# Patient Record
Sex: Female | Born: 1970
Health system: Southern US, Community
[De-identification: ages and names within clinical notes are randomized; demographics above are authoritative.]

## PROBLEM LIST (undated history)

## (undated) DIAGNOSIS — K219 Gastro-esophageal reflux disease without esophagitis: Secondary | ICD-10-CM

## (undated) DIAGNOSIS — M2550 Pain in unspecified joint: Secondary | ICD-10-CM

## (undated) DIAGNOSIS — F988 Other specified behavioral and emotional disorders with onset usually occurring in childhood and adolescence: Secondary | ICD-10-CM

## (undated) DIAGNOSIS — E039 Hypothyroidism, unspecified: Secondary | ICD-10-CM

## (undated) DIAGNOSIS — F32A Depression, unspecified: Secondary | ICD-10-CM

## (undated) DIAGNOSIS — G4733 Obstructive sleep apnea (adult) (pediatric): Secondary | ICD-10-CM

## (undated) DIAGNOSIS — G4710 Hypersomnia, unspecified: Secondary | ICD-10-CM

## (undated) DIAGNOSIS — K589 Irritable bowel syndrome without diarrhea: Secondary | ICD-10-CM

## (undated) DIAGNOSIS — G4730 Sleep apnea, unspecified: Secondary | ICD-10-CM

## (undated) DIAGNOSIS — J45909 Unspecified asthma, uncomplicated: Secondary | ICD-10-CM

## (undated) DIAGNOSIS — E7800 Pure hypercholesterolemia, unspecified: Secondary | ICD-10-CM

## (undated) DIAGNOSIS — T8859XA Other complications of anesthesia, initial encounter: Secondary | ICD-10-CM

## (undated) DIAGNOSIS — L710 Perioral dermatitis: Secondary | ICD-10-CM

## (undated) DIAGNOSIS — M1990 Unspecified osteoarthritis, unspecified site: Secondary | ICD-10-CM

## (undated) DIAGNOSIS — J309 Allergic rhinitis, unspecified: Secondary | ICD-10-CM

## (undated) DIAGNOSIS — F419 Anxiety disorder, unspecified: Secondary | ICD-10-CM

## (undated) DIAGNOSIS — D369 Benign neoplasm, unspecified site: Secondary | ICD-10-CM

## (undated) DIAGNOSIS — M542 Cervicalgia: Secondary | ICD-10-CM

## (undated) DIAGNOSIS — R053 Chronic cough: Secondary | ICD-10-CM

## (undated) DIAGNOSIS — Z973 Presence of spectacles and contact lenses: Secondary | ICD-10-CM

## (undated) DIAGNOSIS — E559 Vitamin D deficiency, unspecified: Secondary | ICD-10-CM

## (undated) DIAGNOSIS — D649 Anemia, unspecified: Secondary | ICD-10-CM

## (undated) HISTORY — PX: COLONOSCOPY: SHX174

## (undated) HISTORY — PX: BUNIONECTOMY: SHX129

## (undated) HISTORY — DX: Chronic cough: R05.3

## (undated) HISTORY — DX: Cervicalgia: M54.2

## (undated) HISTORY — DX: Hypothyroidism, unspecified: E03.9

## (undated) HISTORY — DX: Sleep apnea, unspecified: G47.30

## (undated) HISTORY — PX: CHOLECYSTECTOMY: SHX55

## (undated) HISTORY — DX: Perioral dermatitis: L71.0

## (undated) HISTORY — PX: ESOPHAGOGASTRODUODENOSCOPY: SHX1529

## (undated) HISTORY — DX: Obstructive sleep apnea (adult) (pediatric): G47.33

## (undated) HISTORY — DX: Anxiety disorder, unspecified: F41.9

## (undated) HISTORY — DX: Sleep apnea, unspecified: G47.10

## (undated) HISTORY — DX: Unspecified asthma, uncomplicated: J45.909

## (undated) HISTORY — DX: Benign neoplasm, unspecified site: D36.9

## (undated) HISTORY — DX: Pure hypercholesterolemia, unspecified: E78.00

## (undated) HISTORY — PX: OTHER SURGICAL HISTORY: SHX169

## (undated) HISTORY — DX: Gastro-esophageal reflux disease without esophagitis: K21.9

## (undated) HISTORY — DX: Irritable bowel syndrome, unspecified: K58.9

## (undated) HISTORY — DX: Other specified behavioral and emotional disorders with onset usually occurring in childhood and adolescence: F98.8

## (undated) HISTORY — DX: Pain in unspecified joint: M25.50

## (undated) HISTORY — DX: Allergic rhinitis, unspecified: J30.9

## (undated) HISTORY — DX: Depression, unspecified: F32.A

## (undated) HISTORY — DX: Vitamin D deficiency, unspecified: E55.9

---

## 2003-02-11 ENCOUNTER — Encounter: Payer: Self-pay | Admitting: Neurosurgery

## 2003-02-11 ENCOUNTER — Encounter: Admission: RE | Admit: 2003-02-11 | Discharge: 2003-02-11 | Payer: Self-pay | Admitting: Neurosurgery

## 2003-02-25 ENCOUNTER — Encounter: Payer: Self-pay | Admitting: Neurosurgery

## 2003-02-25 ENCOUNTER — Encounter: Admission: RE | Admit: 2003-02-25 | Discharge: 2003-02-25 | Payer: Self-pay | Admitting: Neurosurgery

## 2003-03-25 ENCOUNTER — Encounter: Admission: RE | Admit: 2003-03-25 | Discharge: 2003-03-25 | Payer: Self-pay | Admitting: Neurosurgery

## 2003-03-25 ENCOUNTER — Encounter: Payer: Self-pay | Admitting: Neurosurgery

## 2004-09-15 ENCOUNTER — Ambulatory Visit: Payer: Self-pay | Admitting: Internal Medicine

## 2004-12-18 ENCOUNTER — Ambulatory Visit: Payer: Self-pay | Admitting: Internal Medicine

## 2005-09-28 ENCOUNTER — Other Ambulatory Visit: Payer: Self-pay

## 2005-09-28 ENCOUNTER — Emergency Department: Payer: Self-pay | Admitting: Emergency Medicine

## 2005-10-16 ENCOUNTER — Ambulatory Visit: Payer: Self-pay | Admitting: Internal Medicine

## 2006-02-04 ENCOUNTER — Ambulatory Visit: Payer: Self-pay | Admitting: Obstetrics and Gynecology

## 2006-03-05 ENCOUNTER — Ambulatory Visit: Payer: Self-pay | Admitting: Gastroenterology

## 2006-03-20 ENCOUNTER — Ambulatory Visit: Payer: Self-pay | Admitting: Surgery

## 2006-11-22 ENCOUNTER — Ambulatory Visit: Payer: Self-pay | Admitting: Podiatry

## 2008-08-24 ENCOUNTER — Ambulatory Visit: Payer: Self-pay | Admitting: Internal Medicine

## 2008-08-26 ENCOUNTER — Ambulatory Visit: Payer: Self-pay | Admitting: Internal Medicine

## 2009-08-30 ENCOUNTER — Ambulatory Visit: Payer: Self-pay | Admitting: Obstetrics and Gynecology

## 2009-09-01 ENCOUNTER — Ambulatory Visit: Payer: Self-pay | Admitting: Obstetrics and Gynecology

## 2010-03-29 ENCOUNTER — Ambulatory Visit: Payer: Self-pay | Admitting: Obstetrics and Gynecology

## 2011-04-11 ENCOUNTER — Ambulatory Visit: Payer: Self-pay | Admitting: Obstetrics and Gynecology

## 2012-04-10 ENCOUNTER — Ambulatory Visit: Payer: Self-pay | Admitting: Internal Medicine

## 2012-04-11 ENCOUNTER — Ambulatory Visit: Payer: Self-pay | Admitting: Internal Medicine

## 2012-04-15 ENCOUNTER — Ambulatory Visit: Payer: Self-pay | Admitting: Obstetrics and Gynecology

## 2012-06-12 ENCOUNTER — Ambulatory Visit: Payer: Self-pay | Admitting: Internal Medicine

## 2012-07-16 ENCOUNTER — Ambulatory Visit: Payer: Self-pay | Admitting: Allergy

## 2012-10-01 ENCOUNTER — Other Ambulatory Visit: Payer: Self-pay | Admitting: Internal Medicine

## 2012-10-01 LAB — TSH: Thyroid Stimulating Horm: 3.48 u[IU]/mL

## 2012-10-01 LAB — LIPID PANEL
HDL Cholesterol: 57 mg/dL (ref 40–60)
Triglycerides: 92 mg/dL (ref 0–200)
VLDL Cholesterol, Calc: 18 mg/dL (ref 5–40)

## 2012-10-01 LAB — COMPREHENSIVE METABOLIC PANEL
Anion Gap: 8 (ref 7–16)
BUN: 12 mg/dL (ref 7–18)
Creatinine: 0.74 mg/dL (ref 0.60–1.30)
EGFR (African American): >60
EGFR (Non-African Amer.): >60
Glucose: 77 mg/dL (ref 65–99)
Osmolality: 280 (ref 275–301)
SGOT(AST): 19 U/L (ref 15–37)
SGPT (ALT): 19 U/L (ref 12–78)
Sodium: 141 mmol/L (ref 136–145)
Total Protein: 7.1 g/dL (ref 6.4–8.2)

## 2012-10-01 LAB — CBC WITH DIFFERENTIAL/PLATELET
Eosinophil #: 0.4 10*3/uL (ref 0.0–0.7)
Eosinophil %: 6.5 %
HGB: 12.7 g/dL (ref 12.0–16.0)
Lymphocyte #: 1.2 10*3/uL (ref 1.0–3.6)
MCV: 97 fL (ref 80–100)
Monocyte #: 0.4 x10 3/mm (ref 0.2–0.9)
Monocyte %: 8 %
Neutrophil %: 60.4 %
Platelet: 284 10*3/uL (ref 150–440)
RBC: 3.79 10*6/uL — ABNORMAL LOW (ref 3.80–5.20)

## 2012-10-01 LAB — URINALYSIS, COMPLETE
Bacteria: NONE SEEN
Bilirubin,UR: NEGATIVE
Glucose,UR: NEGATIVE mg/dL (ref 0–75)
Ketone: NEGATIVE
Specific Gravity: 1.019 (ref 1.003–1.030)
WBC UR: 4 /HPF (ref 0–5)

## 2012-10-01 LAB — BILIRUBIN, DIRECT: Bilirubin, Direct: <0.1 mg/dL (ref 0.00–0.20)

## 2013-07-01 ENCOUNTER — Ambulatory Visit: Payer: Self-pay | Admitting: Internal Medicine

## 2013-07-31 ENCOUNTER — Ambulatory Visit: Payer: Self-pay | Admitting: Obstetrics and Gynecology

## 2014-03-24 ENCOUNTER — Other Ambulatory Visit: Payer: Self-pay | Admitting: Gastroenterology

## 2014-03-27 LAB — STOOL CULTURE

## 2014-04-16 ENCOUNTER — Ambulatory Visit: Payer: Self-pay | Admitting: Gastroenterology

## 2014-04-21 LAB — PATHOLOGY REPORT

## 2014-05-12 DIAGNOSIS — K589 Irritable bowel syndrome without diarrhea: Secondary | ICD-10-CM | POA: Insufficient documentation

## 2014-09-20 ENCOUNTER — Telehealth: Payer: Self-pay | Admitting: Neurology

## 2014-09-20 ENCOUNTER — Ambulatory Visit (INDEPENDENT_AMBULATORY_CARE_PROVIDER_SITE_OTHER): Payer: 59 | Admitting: Neurology

## 2014-09-20 ENCOUNTER — Encounter: Payer: Self-pay | Admitting: Neurology

## 2014-09-20 VITALS — BP 114/72 | HR 83 | Temp 98.1°F | Resp 14 | Ht 64.0 in | Wt 175.0 lb

## 2014-09-20 DIAGNOSIS — J45909 Unspecified asthma, uncomplicated: Secondary | ICD-10-CM | POA: Insufficient documentation

## 2014-09-20 DIAGNOSIS — J4520 Mild intermittent asthma, uncomplicated: Secondary | ICD-10-CM

## 2014-09-20 DIAGNOSIS — G4710 Hypersomnia, unspecified: Secondary | ICD-10-CM

## 2014-09-20 DIAGNOSIS — R0683 Snoring: Secondary | ICD-10-CM

## 2014-09-20 MED ORDER — FLUOXETINE HCL 10 MG PO CAPS: 10.0000 mg | ORAL_CAPSULE | Freq: Every day | ORAL | Status: DC

## 2014-09-20 NOTE — Progress Notes (Signed)
SLEEP MEDICINE CLINIC   Provider:  Larey Seat, M D  Referring Provider: Tracie Harrier, MD Primary Care Physician:   Chief Complaint  Patient presents with  . NP Sleep, Hande    Rm 11, daughter    HPI:  Heather Faulkner is a 43 y.o. female, who is seen here as a referral from Dr.  Geralyn Corwin for a consultation and  re-evaluation of her hypersomnia.   Mrs. Shore reports that her mother confirmed that she has been sleepier than average child's in the past and that she had been someone who always required more sleep. This became most evident after her pregnancies. 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 She did not have shift work. She became more depressed over the last 7 years and wondered if depression or the medications to treat it made her sleepiness worse. She works now for the [employer], is divorced and basically a single mother. There has certainly been a lot of social stressors in her life. She has been followed by Dr. Lora Paula. Her sleepiness as well as her depression has a waning and waxing character. She has been taking a Ventolin inhaler for asthma, Tessalon as needed Rhinocort as needed Klonopin once a day or 0.5 mg, 20 mL of Lexapro daily and she has tried [place] to help her sleep at 3 mg further listed are hyoscyamine Advair Xyzal, Synthroid> She had 2 nasal surgeries in the past.  She does still suffer from allergic rhinitis as well as asthma, hypersomnia and has hypothyroidism.  The sleep habits include a bedtime of 23.00 hours, but is currently on FMLA. She can go earlier now. Her bedroom is cool , quiet and dark, no TV. Reading in an I -pad for several minutes. She will be asleep in 15 minutes or less. She wakes up frequently, repositioning herself in bed . Between 2 and 3 AM will wake and needs some time to go back to sleep. She rises at 6.15  needs an alarm. She does  not feel refreshed and restored. Normally, she sleeps alone- sometimes with the kid ( if sick) sometimes with the dog.  She tries to avoid naps but sometimes gives in- she has an irresistible urge to sleep. She keeps physically active to avoid drowsiness. She has often vivid dreams. She has no sleep paralysis, no dream intrusion, no REM behavior.  She recalls less dreams since being on Lexapro.   The patient underwent a sleep study with a [facility] not long ago in May 2013. At the time she was diagnosed with mild sleep apnea and following the sleep study and MS LT was performed which confirmed a very low sleep latency but normal REM onsets. The patient was already on Lexapro at the time. She had a much higher RDI and AHI during that study. No periodic limb movements were noted. No central apneas were scored. CO2 retention was not found. The overall AHI was 1.5 per hour and the RDI was 8.3. I am not sure if this study was scored with 3% desaturation or 4% desaturation scoring. She had borderline bradycardia and tachycardia during the night. Her MS LT shoulder initial sleep latency ties of 1 minutes 1 time of 1.5 minutes and ties of 3.5 minutes. This places the mean sleep latency of just above 2 minutes. This is a pathological result. Also no REM sleep onset was noted,  this can well be due to the REM Supressant medication she took at the time.   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    Review of Systems: Out of a complete 14 system review, the patient complains of only the following symptoms, and all other reviewed systems are negative.  Hypersomnia .  Epworth score 17 !! out 24  , Fatigue severity score 42  , depression score n/a .    History   Social History  . Marital Status: Divorced    Spouse Name: N/A    Number of Children: N/A  . Years of Education: N/A   Occupational History  . Not on file.    Social History Main Topics  . Smoking status: Former Smoker    Types: Cigarettes  . Smokeless tobacco: Not on file     Comment: social in college  . Alcohol Use: Yes     Comment: occ 2-3 x week  . Drug Use: No  . Sexual Activity: Not on file   Other Topics Concern  . Not on file   Social History Narrative   Right handed, FT - Licensed clinical SW, (on FMLA), Masters SW, Caffeine 2-3 cups coffee, Divorced, 2 kids    Family History  Problem Relation Age of Onset  . Hypertension Mother   . Hyperlipidemia Mother   . Hypertension Father   . Diabetes Maternal Grandfather   . Heart attack Maternal Grandfather   . Stroke Paternal Grandmother   . Celiac disease Daughter     Past Medical History  Diagnosis Date  . Hypothyroidism   . Hypersomnia with sleep apnea   . Asthma   . Allergic rhinitis   . Anxiety   . Tubular adenoma   . Irritable bowel syndrome   . GERD (gastroesophageal reflux disease)   . Persistent hypersomnia     Past Surgical History  Procedure Laterality Date  . Colonoscopy      x 2  . Cesarean      x 2  . Cholecystectomy    . Nasal surgery (polyps)      x2  . Bunionectomy Right     Current Outpatient Prescriptions  Medication Sig Dispense Refill  . albuterol (VENTOLIN HFA) 108 (90 BASE) MCG/ACT inhaler Inhale into the lungs every 6 (six) hours as needed.       . Armodafinil (NUVIGIL) 200 MG TABS Take 200 mg by mouth daily.      . benzonatate (TESSALON) 100 MG capsule Take 100 mg by mouth 3 (three) times daily as needed.       . budesonide (RHINOCORT AQUA) 32 MCG/ACT nasal spray USE 2 SPRAYS IN EACH NOSTRIL DAILY      . buPROPion (WELLBUTRIN XL) 150 MG 24 hr tablet Take 300 mg by mouth daily.      . clonazePAM (KLONOPIN) 0.5 MG tablet Take 0.5 mg by mouth. 1/2 tab bid and then 1 tablet po qhs prn      . Eszopiclone 3 MG TABS Take 2 mg by mouth.      . etonogestrel (NEXPLANON) 68 MG IMPL implant 1 each by Subdermal route once.      [name] FLUoxetine  (PROZAC) 20 MG capsule Take 20 mg by mouth daily.      . Fluticasone-Salmeterol (ADVAIR) 100-50 MCG/DOSE AEPB Inhale 1 puff into the lungs 2 (two) times daily.      [name] levocetirizine (XYZAL) 5 MG tablet  Take 5 mg by mouth every evening.       [name] levothyroxine (SYNTHROID) 112 MCG tablet Take by mouth.      . montelukast (SINGULAIR) 10 MG tablet TAKE ONE TABLET BY MOUTH DAILY      . triazolam (HALCION) 0.125 MG tablet Take 0.25 mg by mouth at bedtime as needed.        No current facility-administered medications for this visit.    Allergies as of 09/20/2014  . (No Known Allergies)    Vitals: BP 114/72  Pulse 83  Temp(Src) 98.1 F (36.7 C) (Oral)  Resp 14  Ht _0  (1.626 m)  Wt 175 lb (79.379 kg)  BMI 30.02 kg/m2  LMP 09/15/2014 Last Weight:  Wt Readings from Last 1 Encounters:  09/20/14 175 lb (79.379 kg)       Last Height:   Ht Readings from Last 1 Encounters:  09/20/14 _1  (1.626 m)    Physical exam:  General: The patient is awake, alert and appears not in acute distress. The patient is well groomed. Head: Normocephalic, atraumatic. Neck is supple. Mallampati 2   neck circumference: 15 . Nasal airflow unrestricted , TMJ is  Not evident . Retrognathia is not seen. Lower jaw is crowded ( dentition)  Cardiovascular:  Regular rate and rhythm, without  murmurs or carotid bruit, and without distended neck veins. Respiratory: Lungs are clear to auscultation. Skin:  Without evidence of edema, or rash Trunk: BMI is elevated and patient  has normal posture.  Neurologic exam : The patient is awake and alert, oriented to place and time.   Memory subjective  described as intact. There is a normal attention span & concentration ability. Speech is fluent without dysarthria, dysphonia or aphasia.  Mood and affect are depressed. Cranial nerves: Pupils are equal and briskly reactive to light. Funduscopic exam without evidence of pallor or edema. Extraocular movements  in vertical and  horizontal planes intact and without nystagmus. Visual fields by finger perimetry are intact. Hearing to finger rub intact.  Facial sensation intact to fine touch. Facial motor strength is symmetric and tongue and uvula move midline.  Motor exam:  Normal tone, muscle bulk and symmetric ,strength in all extremities.  Sensory:  Fine touch, pinprick and vibration were tested in all extremities. Proprioception is  normal.  Coordination: Rapid alternating movements in the fingers/hands is normal. Finger-to-nose maneuver normal without evidence of ataxia, dysmetria or tremor.  Gait and station: Patient walks without assistive device and is able unassisted to climb up to the exam table.  Strength within normal limits. Stance is stable and normal.  Deep tendon reflexes: in the  upper and lower extremities are symmetric and intact.   Assessment:  After physical and neurologic examination, review of laboratory studies, imaging, neurophysiology testing and pre-existing records, assessment is   1) patient with longstanding history of HYPERSOMNIA- tested for narcolepsy by [facility] in 03-2012, yet no SREM s were noted.  Her mean sleep latency was 2.2 minutes and is highly abnormal. Her metabolic panels were repeatedly normal. 2) medication related REM supression- I would like to work with Dr Casimiro Needle to reduce and wean temporarily off SSRI, and any stimulant medication.( If that is not possible , HLA alone)  Her Epworth justifies a repeat MSLT and repeat PSG prior to the MSLT. 3) HLA narcolepsy test for the possible autoimmune origin of the disorder.  Parallel to the MSLT, as insurances do not accept the blood test alone.  The patient was advised of the nature of the diagnosed sleep disorder , the treatment options and risks for general a health and wellness arising from not treating the condition. Visit duration was 45 minutes.   Plan:  Treatment plan and additional workup : wean to 10 mg Prozac daily  from 20 mg for 8 days, than 10 mg every other day.  For 8 days prior to sleep study avoid sleep aids, only melatonin allowed.  Do not drive within 6 hours of taking any sleep aid.       Asencion Partridge Tishawna Larouche MD  09/20/2014

## 2014-09-20 NOTE — Telephone Encounter (Signed)
I would like for her to have it ASAP , can we ask Heather Faulkner if he can open WE nights with  Heather Faulkner huntley?

## 2014-09-20 NOTE — Telephone Encounter (Signed)
Pt does not want to wait until December for her sleep study.  Would like to know if you can order her sleep study to be done at [facility] sleep lab.

## 2014-09-20 NOTE — Patient Instructions (Signed)
Armodafinil tablets What is this medicine? ARMODAFINIL (ar moe DAF i nil) is used to treat excessive sleepiness caused by certain sleep disorders. This includes narcolepsy, sleep apnea, and shift work sleep disorder. This medicine may be used for other purposes; ask your health care provider or pharmacist if you have questions. COMMON BRAND NAME(S): Nuvigil What should I tell my health care provider before I take this medicine? They need to know if you have any of these conditions: -kidney disease -liver disease -an unusual or allergic reaction to armodafinil, modafinil, medicines, foods, dyes, or preservatives -pregnant or trying to get pregnant -breast-feeding How should I use this medicine? Take this medicine by mouth with a glass of water. Follow the directions on the prescription label. Take your doses at regular intervals. Do not take your medicine more often than directed. Do not stop taking except on your doctor's advice. A special MedGuide will be given to you by the pharmacist with each prescription and refill. Be sure to read this information carefully each time. Talk to your pediatrician regarding the use of this medicine in children. While this drug may be prescribed for children as young as 66 years of age for selected conditions, precautions do apply. Overdosage: If you think you have taken too much of this medicine contact a poison control center or emergency room at once. NOTE: This medicine is only for you. Do not share this medicine with others. What if I miss a dose? If you miss a dose, take it as soon as you can. If it is almost time for your next dose, take only that dose. Do not take double or extra doses. What may interact with this medicine? Do not take this medicine with any of the following medications: -amphetamine or dextroamphetamine -dexmethylphenidate or methylphenidate -MAOIs like Carbex, Eldepryl, Marplan, Nardil, and Parnate -pemoline -procarbazine This  medicine may also interact with the following medications: -antifungal medicines like itraconazole or ketoconazole -barbiturates, like phenobarbital -birth control pills or other hormone-containing birth control devices or implants -carbamazepine -cyclosporine -diazepam -medicines for depression, anxiety, or psychotic disturbances -phenytoin -propranolol -triazolam -warfarin This list may not describe all possible interactions. Give your health care provider a list of all the medicines, herbs, non-prescription drugs, or dietary supplements you use. Also tell them if you smoke, drink alcohol, or use illegal drugs. Some items may interact with your medicine. What should I watch for while using this medicine? Visit your doctor or health care professional for regular checks on your progress. The full effect of this medicine may not be seen right away. This medicine may affect your concentration, function, or may hide signs that you are tired. You may get dizzy. Do not drive, use machinery, or do anything that needs mental alertness until you know how this drug affects you. Alcohol can make you more dizzy and may interfere with your response to this medicine or your alertness. Avoid alcoholic drinks. Birth control pills may not work properly while you are taking this medicine. Talk to your doctor about using an extra method of birth control. It is unknown if the effects of this medicine will be increased by the use of caffeine. Caffeine is found in many foods, beverages, and medications. Ask your doctor if you should limit or change your intake of caffeine-containing products while on this medicine. What side effects may I notice from receiving this medicine? Side effects that you should report to your doctor or health care professional as soon as possible: -allergic reactions like skin  rash, itching or hives, swelling of the face, lips, or tongue -breathing problems -chest pain -fast, irregular  heartbeat -increased blood pressure, particularly if you have high blood pressure -mental problems -sore throat, fever, or chills -tremors -vomiting Side effects that usually do not require medical attention (report to your doctor or health care professional if they continue or are bothersome): -headache -nausea, diarrhea, or stomach upset -nervousness -trouble sleeping This list may not describe all possible side effects. Call your doctor for medical advice about side effects. You may report side effects to FDA at 1-800-FDA-1088. Where should I keep my medicine? Keep out of the reach of children. Store at room temperature between 20 and 25 degrees C (68 and 77 degrees F). Throw away any unused medicine after the expiration date. NOTE: This sheet is a summary. It may not cover all possible information. If you have questions about this medicine, talk to your doctor, pharmacist, or health care provider.  2015, Elsevier/Gold Standard. (2009-10-25 21:10:28)  

## 2014-10-05 ENCOUNTER — Telehealth: Payer: Self-pay | Admitting: *Deleted

## 2014-10-05 NOTE — Telephone Encounter (Signed)
I called and was not able to LM on her phone.  I mailed copy of her narcolepsy results to her.

## 2014-10-05 NOTE — Telephone Encounter (Signed)
We have attempted to contact patient and her Mailbox is full for earlier canceled appt.'s.  Heather Faulkner has not offered to work as of yet.

## 2014-10-05 NOTE — Telephone Encounter (Signed)
Please defer her study to [place] or [place] [facility] , as she cannot wait longer. DID james not call back yet ??

## 2014-10-06 NOTE — Telephone Encounter (Signed)
Please refer sleep study to other lab, due to patient  being urgent.

## 2014-10-07 NOTE — Telephone Encounter (Signed)
Please be advised that this patient is scheduled for a MSLT and has a weaning schedule.  I have spoken with her previously and she is aware that it is not in her best interest to move the currently scheduled appointment.  She has no desire for a sooner appointment at this time.

## 2014-10-31 ENCOUNTER — Ambulatory Visit (INDEPENDENT_AMBULATORY_CARE_PROVIDER_SITE_OTHER): Payer: 59 | Admitting: Neurology

## 2014-10-31 DIAGNOSIS — R0683 Snoring: Secondary | ICD-10-CM

## 2014-10-31 DIAGNOSIS — G4710 Hypersomnia, unspecified: Secondary | ICD-10-CM

## 2014-10-31 DIAGNOSIS — G4719 Other hypersomnia: Secondary | ICD-10-CM

## 2014-10-31 NOTE — Sleep Study (Signed)
Please see the scanned sleep study interpretation located in the Procedure tab within the Chart Review section. 

## 2014-11-12 ENCOUNTER — Encounter: Payer: Self-pay | Admitting: Neurology

## 2014-11-12 ENCOUNTER — Telehealth: Payer: Self-pay | Admitting: Neurology

## 2014-11-12 NOTE — Telephone Encounter (Signed)
Called the patient and left a message briefly explaining the results from her recent NPSG/MSLT.  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 Brett Fairy has recommended that the patient proceed with an HLA test.  A copy of her report was sent to Dr. Ginette Pitman for review and the patient has elected to receive her sleep study via mail.

## 2014-11-17 DIAGNOSIS — J4520 Mild intermittent asthma, uncomplicated: Secondary | ICD-10-CM | POA: Insufficient documentation

## 2014-12-20 ENCOUNTER — Ambulatory Visit: Payer: 59 | Admitting: Neurology

## 2014-12-29 ENCOUNTER — Ambulatory Visit (INDEPENDENT_AMBULATORY_CARE_PROVIDER_SITE_OTHER): Payer: 59 | Admitting: Neurology

## 2014-12-29 ENCOUNTER — Encounter: Payer: Self-pay | Admitting: Neurology

## 2014-12-29 VITALS — BP 130/69 | HR 76 | Resp 14 | Ht 63.5 in | Wt 180.0 lb

## 2014-12-29 DIAGNOSIS — G4710 Hypersomnia, unspecified: Secondary | ICD-10-CM

## 2014-12-29 DIAGNOSIS — F3289 Other specified depressive episodes: Secondary | ICD-10-CM | POA: Insufficient documentation

## 2014-12-29 DIAGNOSIS — F519 Sleep disorder not due to a substance or known physiological condition, unspecified: Secondary | ICD-10-CM

## 2014-12-29 DIAGNOSIS — F328 Other depressive episodes: Secondary | ICD-10-CM

## 2014-12-29 MED ORDER — ATOMOXETINE HCL 25 MG PO CAPS: 25.0000 mg | ORAL_CAPSULE | Freq: Every day | ORAL | Status: DC

## 2014-12-29 MED ORDER — ATOMOXETINE HCL 25 MG PO CAPS: ORAL_CAPSULE | ORAL | Status: DC

## 2014-12-29 NOTE — Progress Notes (Signed)
SLEEP MEDICINE CLINIC   Provider:  Larey Seat, M D  Referring Provider: No ref. provider found Primary Care Physician:   Chief Complaint  Patient presents with  . RV sleep results    Rm 11, alone    HPI:  Heather Faulkner is a 44 y.o. female, who is seen here as a referral from Dr.  Elita Quick No ref. provider found for a consultation and  re-evaluation of her hypersomnia.   Heather Faulkner reports that her mother confirmed that she has been sleepier than average child's in the past and that she had been someone who always required more sleep. This became most evident after her pregnancies. 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 She did not have shift work. She became more depressed over the last 7 years and wondered if depression or the medications to treat it made her sleepiness worse. She works now for the [employer], is divorced and basically a single mother. There has certainly been a lot of social stressors in her life. She has been followed by Dr. Lora Paula. Her sleepiness as well as her depression has a waning and waxing character. She has been taking a Ventolin inhaler for asthma, Tessalon as needed Rhinocort as needed Klonopin once a day or 0.5 mg, 20 mL of Lexapro daily and she has tried [place] to help her sleep at 3 mg further listed are hyoscyamine Advair Xyzal, Synthroid>She had 2 nasal surgeries in the past. She does still suffer from allergic rhinitis as well as asthma, hypersomnia and has hypothyroidism.  The sleep habits include a bedtime of 23.00 hours, but is currently on FMLA. She can go earlier now. Her bedroom is cool , quiet and dark, no TV. Reading in an I -pad for several minutes. She will be asleep in 15 minutes or less. She wakes up frequently, repositioning herself in bed . Between 2 and 3 AM will wake and needs some time to go back to sleep. She rises at 6.15  needs an  alarm. She does not feel refreshed and restored. Normally, she sleeps alone- sometimes with the kid ( if sick) sometimes with the dog.  She tries to avoid naps but sometimes gives in- she has an irresistible urge to sleep. She keeps physically active to avoid drowsiness. She has often vivid dreams. She has no sleep paralysis, no dream intrusion, no REM behavior.  She recalls less dreams since being on Lexapro.   The patient underwent a sleep study with a [facility] not long ago in May 2013. At the time she was diagnosed with mild sleep apnea and following the sleep study and MS LT was performed which confirmed a very low sleep latency but normal REM onsets. The patient was already on Lexapro at the time. She had a much higher RDI and AHI during that study. No periodic limb movements were noted. No central apneas were scored. CO2 retention was not found. The overall AHI was 1.5 per hour and the RDI was 8.3. I am not sure if this study was scored with 3% desaturation or 4% desaturation scoring. She had borderline bradycardia and tachycardia during the night. Her MS LT shoulder initial sleep latency ties of 1 minutes 1 time of 1.5 minutes and ties of 3.5 minutes. This places the mean sleep latency of just above 2 minutes. This is a pathological result. Also no REM sleep onset  was noted,  this can well be due to the REM Supressant medication she took at the time. 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     Interval history 12-29-14: Heather Faulkner underwent a sleep study on 10-31-14, after she had endorsed the Epworth sleepiness score again very high at 17 points the pH Q9 from for depression at 12 points and a very high degree of fatigue. She had low blood pressures of 100/66 mmHg her AHI was only 2.2 her RDI 3.9 but she had 181 periodic limb movements these tics and twitches were rhythmic but led to very few arousals only  3.4 per hour which would not be an explanation for the sleepiness as well. The study was valid for M SLT to follow.     Review of Systems: Out of a complete 14 system review, the patient complains of only the following symptoms, and all other reviewed systems are negative.  Hypersomnia .     History   Social History  . Marital Status: Divorced    Spouse Name: N/A    Number of Children: N/A  . Years of Education: N/A   Occupational History  . Not on file.   Social History Main Topics  . Smoking status: Former Smoker    Types: Cigarettes  . Smokeless tobacco: Not on file     Comment: social in college  . Alcohol Use: Yes     Comment: occ 2-3 x week  . Drug Use: No  . Sexual Activity: Not on file   Other Topics Concern  . Not on file   Social History Narrative   Right handed, FT - Licensed clinical SW, (on FMLA), Masters SW, Caffeine 2-3 cups coffee, Divorced, 2 kids    Family History  Problem Relation Age of Onset  . Hypertension Mother   . Hyperlipidemia Mother   . Hypertension Father   . Diabetes Maternal Grandfather   . Heart attack Maternal Grandfather   . Stroke Paternal Grandmother   . Celiac disease Daughter     Past Medical History  Diagnosis Date  . Hypothyroidism   . Hypersomnia with sleep apnea   . Asthma   . Allergic rhinitis   . Anxiety   . Tubular adenoma   . Irritable bowel syndrome   . GERD (gastroesophageal reflux disease)   . Persistent hypersomnia   . OSA (obstructive sleep apnea)     Past Surgical History  Procedure Laterality Date  . Colonoscopy      x 2  . Cesarean      x 2  . Cholecystectomy    . Nasal surgery (polyps)      x2  . Bunionectomy Right     Current Outpatient Prescriptions  Medication Sig Dispense Refill  . albuterol (VENTOLIN HFA) 108 (90 BASE) MCG/ACT inhaler Inhale into the lungs every 6 (six) hours as needed.     . benzonatate (TESSALON) 100 MG capsule Take 100 mg by mouth 3 (three) times daily as  needed.     . budesonide (RHINOCORT AQUA) 32 MCG/ACT nasal spray USE 2 SPRAYS IN EACH NOSTRIL DAILY    . clonazePAM (KLONOPIN) 0.5 MG tablet Take 0.5 mg by mouth. 1/2 tab bid and then 1 tablet po qhs prn    . eszopiclone (LUNESTA) 2 MG TABS tablet Take 2 mg by mouth at bedtime as needed for sleep. Take immediately before bedtime    . etonogestrel (  NEXPLANON) 68 MG IMPL implant 1 each by Subdermal route once.    [name] FLUoxetine (PROZAC) 20 MG capsule Take 20 mg by mouth daily.    . Fluticasone-Salmeterol (ADVAIR) 100-50 MCG/DOSE AEPB Inhale 1 puff into the lungs 2 (two) times daily.    [name] HYDROcodone-homatropine (HYCODAN) 5-1.5 MG/5ML syrup Take by mouth.    . levocetirizine (XYZAL) 5 MG tablet Take 5 mg by mouth every evening.     [name] levofloxacin (LEVAQUIN) 500 MG tablet Take by mouth.    . levothyroxine (SYNTHROID) 112 MCG tablet Take by mouth.    . montelukast (SINGULAIR) 10 MG tablet TAKE ONE TABLET BY MOUTH DAILY    . predniSONE (DELTASONE) 10 MG tablet 6 tabs x 1 day, 5 tabs x 1 day, 4 tabs x 1 day, etc...    . triazolam (HALCION) 0.125 MG tablet Take 0.25 mg by mouth at bedtime as needed.      No current facility-administered medications for this visit.    Allergies as of 12/29/2014  . (No Known Allergies)    Vitals: BP 130/69 mmHg  Pulse 76  Resp 14  Ht 5' 3.5" (1.613 m)  Wt 180 lb (81.647 kg)  BMI 31.38 kg/m2 Last Weight:  Wt Readings from Last 1 Encounters:  12/29/14 180 lb (81.647 kg)       Last Height:   Ht Readings from Last 1 Encounters:  12/29/14 5' 3.5" (1.613 m)    Physical exam:  General: The patient is awake, alert and appears not in acute distress. The patient is well groomed. Head: Normocephalic, atraumatic. Neck is supple. Mallampati 2   neck circumference: 15 . Nasal airflow unrestricted , TMJ is  Not evident . Retrognathia is not seen. Lower jaw is crowded ( dentition)  Cardiovascular:  Regular rate and rhythm, without  murmurs or carotid bruit, and without  distended neck veins. Respiratory: Lungs are clear to auscultation. Skin:  Without evidence of edema, or rash Trunk: BMI is elevated and patient  has normal posture.  Neurologic exam : The patient is awake and alert, oriented to place and time.   Memory subjective  described as intact. There is a normal attention span & concentration ability. Speech is fluent without dysarthria, dysphonia or aphasia.  Mood and affect are depressed. Cranial nerves: Pupils are equal and briskly reactive to light. Hearing to finger rub intact.   Facial sensation intact to fine touch. Facial motor strength is symmetric and tongue and uvula move midline.  Motor exam:  Normal tone, muscle bulk and symmetric, strength in all extremities.  Sensory:  Fine touch, pinprick and vibration,  Proprioception is normal.  Finger-to-nose maneuver normal without evidence of ataxia, dysmetria or tremor.  Gait and station: Patient walks without assistive device and is able unassisted to climb up to the exam table.  Strength within normal limits. Stance is stable and normal.  Deep tendon reflexes: in the  upper and lower extremities are symmetric and intact.   Assessment:  After physical and neurologic examination, review of laboratory studies, imaging, neurophysiology testing and pre-existing records, assessment is  1) no apnea found in PSG, mils PLMs,  Sleep efficiency was high - 490 minutes of sleep !! Her  Perception is different .  MSLT : sleep latency was 8.7 minutes several of her naps were seen with a very brief sleep latency especially the first one in the morning at 1 minute following by 6, 13 and 17.5 minutes. No REM sleep was seen. Given the history  of high fatigue and HLA test was recommended , HLA was negative.    The patient was advised of the nature of the diagnosed sleep disorder, ideopathic hypersomnia, anxiety and panic   , the treatment options and risks for general a health and wellness arising from not  treating the condition. Visit duration was 25 minutes.   Plan:  Treatment plan and additional workup : resumed medication Prozac daily  20 mg. The patient has been suffering from panic attacks, and is in therapy with Dr. Casimiro Needle. i would like for her to take a stimulant. Ritalin was causing palpitation. NUVIGIL did the same  . Under medication, she may be able to return to a 8 hour work day.   Strattera in day time, Klonopin  0.25 mg at night.  Do not drive within 6 hours of taking any sleep aid.   Prn follow up, Dr Casimiro Needle  and PCP got CC.       Heather Partridge Jasiel Apachito MD  12/29/2014

## 2014-12-30 ENCOUNTER — Telehealth: Payer: Self-pay

## 2014-12-30 NOTE — Telephone Encounter (Signed)
Rx has been faxed.

## 2014-12-30 NOTE — Telephone Encounter (Signed)
Patient returned call and would her Rx faxed.

## 2014-12-30 NOTE — Telephone Encounter (Signed)
I called the patient to inquire if she would like the Strattera Rx faxed directly to her pharmacy, or if she prefers to get the written Rx.  Got no answer.  Left message

## 2015-01-04 DIAGNOSIS — G4719 Other hypersomnia: Secondary | ICD-10-CM | POA: Insufficient documentation

## 2015-01-21 ENCOUNTER — Ambulatory Visit: Payer: 59 | Admitting: Neurology

## 2015-06-07 ENCOUNTER — Other Ambulatory Visit
Admission: RE | Admit: 2015-06-07 | Discharge: 2015-06-07 | Disposition: A | Discharge status: Home / Self Care | Payer: 59 | Source: Ambulatory Visit | Attending: Physician Assistant | Admitting: Physician Assistant

## 2015-06-07 DIAGNOSIS — R5383 Other fatigue: Secondary | ICD-10-CM | POA: Diagnosis not present

## 2015-06-07 LAB — CBC WITH DIFFERENTIAL/PLATELET
BASOS PCT: 1 %
Basophils Absolute: 0.1 10*3/uL (ref 0–0.1)
Eosinophils Absolute: 0.4 10*3/uL (ref 0–0.7)
Eosinophils Relative: 6 %
HEMATOCRIT: 38.6 % (ref 35.0–47.0)
Hemoglobin: 13.1 g/dL (ref 12.0–16.0)
LYMPHS ABS: 1.3 10*3/uL (ref 1.0–3.6)
LYMPHS PCT: 20 %
MCH: 32.1 pg (ref 26.0–34.0)
MCHC: 34 g/dL (ref 32.0–36.0)
MCV: 94.6 fL (ref 80.0–100.0)
Monocytes Absolute: 0.4 10*3/uL (ref 0.2–0.9)
Monocytes Relative: 6 %
NEUTROS PCT: 67 %
Neutro Abs: 4.5 10*3/uL (ref 1.4–6.5)
Platelets: 280 10*3/uL (ref 150–440)
RBC: 4.08 MIL/uL (ref 3.80–5.20)
RDW: 12.8 % (ref 11.5–14.5)
WBC: 6.7 10*3/uL (ref 3.6–11.0)

## 2015-06-07 LAB — COMPREHENSIVE METABOLIC PANEL
ALT: 18 U/L (ref 14–54)
AST: 21 U/L (ref 15–41)
Albumin: 4.1 g/dL (ref 3.5–5.0)
Alkaline Phosphatase: 95 U/L (ref 38–126)
Anion gap: 8 (ref 5–15)
BUN: 13 mg/dL (ref 6–20)
CO2: 25 mmol/L (ref 22–32)
CREATININE: 0.7 mg/dL (ref 0.44–1.00)
Calcium: 8.9 mg/dL (ref 8.9–10.3)
Chloride: 105 mmol/L (ref 101–111)
GFR calc non Af Amer: >60 mL/min (ref >60)
GLUCOSE: 88 mg/dL (ref 65–99)
POTASSIUM: 4 mmol/L (ref 3.5–5.1)
Sodium: 138 mmol/L (ref 135–145)
Total Bilirubin: 0.6 mg/dL (ref 0.3–1.2)
Total Protein: 6.9 g/dL (ref 6.5–8.1)

## 2015-06-07 LAB — URINALYSIS COMPLETE WITH MICROSCOPIC (ARMC ONLY)
Bilirubin Urine: NEGATIVE
Glucose, UA: NEGATIVE mg/dL
KETONES UR: NEGATIVE mg/dL
NITRITE: NEGATIVE
Protein, ur: NEGATIVE mg/dL
Specific Gravity, Urine: 1.018 (ref 1.005–1.030)
pH: 5 (ref 5.0–8.0)

## 2015-06-07 LAB — HEMOGLOBIN A1C: Hgb A1c MFr Bld: 5.1 % (ref 4.0–6.0)

## 2015-06-09 LAB — THYROID PANEL WITH TSH
Free Thyroxine Index: 2.2 (ref 1.2–4.9)
T3 UPTAKE RATIO: 30 % (ref 24–39)
T4 TOTAL: 7.3 ug/dL (ref 4.5–12.0)
TSH: 3.96 u[IU]/mL (ref 0.450–4.500)

## 2015-06-30 ENCOUNTER — Ambulatory Visit: Payer: 59 | Admitting: Adult Health

## 2015-07-14 ENCOUNTER — Ambulatory Visit (INDEPENDENT_AMBULATORY_CARE_PROVIDER_SITE_OTHER): Payer: 59 | Admitting: Adult Health

## 2015-07-14 ENCOUNTER — Encounter: Payer: Self-pay | Admitting: Adult Health

## 2015-07-14 VITALS — BP 126/86 | HR 67 | Ht 63.0 in | Wt 185.0 lb

## 2015-07-14 DIAGNOSIS — G4710 Hypersomnia, unspecified: Secondary | ICD-10-CM | POA: Diagnosis not present

## 2015-07-14 MED ORDER — ATOMOXETINE HCL 25 MG PO CAPS: ORAL_CAPSULE | ORAL | Status: DC

## 2015-07-14 NOTE — Patient Instructions (Signed)
Continue Strattera 50 mg in the morning. May take 25 mg at lunch time if needed.  If your symptoms worsen or you develop new symptoms please let us know.

## 2015-07-14 NOTE — Progress Notes (Signed)
PATIENT: Heather Faulkner DOB: May 30, 1971  REASON FOR VISIT: follow up- idiopathic hyper insomnia, anxiety HISTORY FROM: patient  HISTORY O F PRESENT ILLNESS: Heather Faulkner is a 44 year old female with a history of idiopathic hypersomnia as well as anxiety. She returns today for an evaluation. She was started on Strattera 50 mg in the morning. She reports that this has been beneficial. However she has not been taking it consistently. She states that it usually works well in the mornings however by the afternoons she begins to get sleepy again. She has also not been taking it on the weekends. She does report some fatigue. She states that the reason she's not taking it consistently is because some nights she feels that she sleeps well and will not need it. Although she now states that later in the day she wishes she would have taken it. She has been tolerating the medication well. Her blood pressure and heart rate are normal today. She has tried other stimulants in the past such as Ritalin, Nuvigil and Provigil but was unable to tolerate these medications. She denies any new neurological symptoms. She returns today for an evaluation.    HISTORY 12/29/14: Heather Faulkner is a 44 y.o. female, who is seen here as a referral from Dr. Elita Quick No ref. provider found for a consultation and re-evaluation of her hypersomnia.   Heather Faulkner reports that her mother confirmed that she has been sleepier than average child's in the past and that she had been someone who always required more sleep. This became most evident after her pregnancies. 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 She did not have shift work. She became more depressed over the last 7 years and wondered if depression or the medications to treat it made her sleepiness worse. She works now for the [employer], is divorced and basically a single mother. There  has certainly been a lot of social stressors in her life. She has been followed by Dr. Lora Paula. Her sleepiness as well as her depression has a waning and waxing character. She has been taking a Ventolin inhaler for asthma, Tessalon as needed Rhinocort as needed Klonopin once a day or 0.5 mg, 20 mL of Lexapro daily and she has tried [place] to help her sleep at 3 mg further listed are hyoscyamine Advair Xyzal, Synthroid>She had 2 nasal surgeries in the past. She does still suffer from allergic rhinitis as well as asthma, hypersomnia and has hypothyroidism.  The sleep habits include a bedtime of 23.00 hours, but is currently on FMLA. She can go earlier now. Her bedroom is cool , quiet and dark, no TV. Reading in an I -pad for several minutes. She will be asleep in 15 minutes or less. She wakes up frequently, repositioning herself in bed . Between 2 and 3 AM will wake and needs some time to go back to sleep. She rises at 6.15 needs an alarm. She does not feel refreshed and restored. Normally, she sleeps alone- sometimes with the kid ( if sick) sometimes with the dog. She tries to avoid naps but sometimes gives in- she has an irresistible urge to sleep. She keeps physically active to avoid drowsiness. She has often vivid dreams. She has no sleep paralysis, no dream intrusion, no REM behavior. She recalls less dreams since being on Lexapro.   The patient underwent a sleep study with a [facility] not long ago in May 2013. At the time she was diagnosed with mild sleep apnea and following the sleep study and Heather LT was performed which confirmed a very low sleep latency but normal REM onsets. The patient was already on Lexapro at the time. She had a much higher RDI and AHI during that study. No periodic limb movements were noted. No central apneas were scored. CO2 retention was not found. The overall AHI was 1.5 per hour and the RDI was 8.3. I am not sure if this study was scored with 3%  desaturation or 4% desaturation scoring. She had borderline bradycardia and tachycardia during the night. Her Heather LT shoulder initial sleep latency ties of 1 minutes 1 time of 1.5 minutes and ties of 3.5 minutes. This places the mean sleep latency of just above 2 minutes. This is a pathological result. Also no REM sleep onset was noted, this can well be due to the REM Supressant medication she took at the time. 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     Interval history 12-29-14: Heather Faulkner underwent a sleep study on 10-31-14, after she had endorsed the Epworth sleepiness score again very high at 17 points the pH Q9 from for depression at 12 points and a very high degree of fatigue. She had low blood pressures of 100/66 mmHg her AHI was only 2.2 her RDI 3.9 but she had 181 periodic limb movements these tics and twitches were rhythmic but led to very few arousals only 3.4 per hour which would not be an explanation for the sleepiness as well. The study was valid for M SLT to follow.     REVIEW OF SYSTEMS: Out of a complete 14 system review of symptoms, the patient complains only of the following symptoms, and all other reviewed systems are negative.  Appetite change, fatigue, unexpected weight change, swollen abdomen, abdominal pain, frequent waking, daytime sleepiness, snoring, dizziness, depression, nervous/anxious Epworth sleepiness score 14 fatigue severity score 47  ALLERGIES: No Known Allergies  HOME MEDICATIONS: Outpatient Prescriptions Prior to Visit  Medication Sig Dispense Refill  . albuterol (VENTOLIN HFA) 108 (90 BASE) MCG/ACT inhaler Inhale into the lungs every 6 (six) hours as needed.     [name] atomoxetine (STRATTERA) 25 MG capsule Use 50 mg in AM po. 60 capsule 2  . benzonatate (TESSALON) 100 MG capsule Take 100 mg by mouth 3 (three) times daily as needed.     . budesonide (RHINOCORT AQUA) 32 MCG/ACT nasal  spray USE 2 SPRAYS IN EACH NOSTRIL DAILY    . clonazePAM (KLONOPIN) 0.5 MG tablet Take 0.5 mg by mouth. 1/2 tab bid and then 1 tablet po qhs prn    . eszopiclone (LUNESTA) 2 MG TABS tablet Take 2 mg by mouth at bedtime as needed for sleep. Take immediately before bedtime    . etonogestrel (NEXPLANON) 68 MG IMPL implant 1 each by Subdermal route once.    . Fluticasone-Salmeterol (ADVAIR) 100-50 MCG/DOSE AEPB Inhale 1 puff into the lungs 2 (two) times daily.    [name] levocetirizine (XYZAL) 5 MG tablet Take 5 mg by mouth every evening.     [name] levothyroxine (SYNTHROID) 112 MCG tablet Take by mouth.    . montelukast (SINGULAIR) 10 MG tablet TAKE ONE TABLET BY MOUTH DAILY    . predniSONE (DELTASONE) 10 MG tablet 6 tabs x 1 day, 5 tabs x 1 day, 4 tabs x 1 day, etc...    [name]  triazolam (HALCION) 0.125 MG tablet Take 0.25 mg by mouth at bedtime as needed.     [name] FLUoxetine (PROZAC) 20 MG capsule Take 20 mg by mouth daily.     No facility-administered medications prior to visit.    PAST MEDICAL HISTORY: Past Medical History  Diagnosis Date  . Hypothyroidism   . Hypersomnia with sleep apnea   . Asthma   . Allergic rhinitis   . Anxiety   . Tubular adenoma   . Irritable bowel syndrome   . GERD (gastroesophageal reflux disease)   . Persistent hypersomnia   . OSA (obstructive sleep apnea)     PAST SURGICAL HISTORY: Past Surgical History  Procedure Laterality Date  . Colonoscopy      x 2  . Cesarean      x 2  . Cholecystectomy    . Nasal surgery (polyps)      x2  . Bunionectomy Right     FAMILY HISTORY: Family History  Problem Relation Age of Onset  . Hypertension Mother   . Hyperlipidemia Mother   . Hypertension Father   . Diabetes Maternal Grandfather   . Heart attack Maternal Grandfather   . Stroke Paternal Grandmother   . Celiac disease Daughter     SOCIAL HISTORY: Social History   Social History  . Marital Status: Divorced    Spouse Name: N/A  . Number of Children: 2  .  Years of Education: N/A   Occupational History  . Not on file.   Social History Main Topics  . Smoking status: Former Smoker    Types: Cigarettes  . Smokeless tobacco: Never Used     Comment: social in college  . Alcohol Use: 0.0 oz/week    0 Standard drinks or equivalent per week     Comment: occ 2-3 x week  . Drug Use: No  . Sexual Activity: Not on file   Other Topics Concern  . 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  Generalized: Well developed, in no acute distress   Neurological examination  Mentation: Alert oriented to time, place, history taking. Follows all commands speech and language fluent Cranial nerve II-XII: Pupils were equal round reactive to light. Extraocular movements were full, visual field were full on confrontational test. Facial sensation and strength were normal. Uvula tongue midline. Head turning and shoulder shrug  were normal and symmetric. Motor: The motor testing reveals 5 over 5 strength of all 4 extremities. Good symmetric motor tone is noted throughout.  Sensory: Sensory testing is intact to soft touch on all 4 extremities. No evidence of extinction is noted.  Coordination: Cerebellar testing reveals good finger-nose-finger and heel-to-shin bilaterally.  Gait and station: Gait is normal. Tandem gait is normal. Romberg is negative. No drift is seen.  Reflexes: Deep tendon reflexes are symmetric and normal bilaterally.   DIAGNOSTIC DATA (LABS, IMAGING, TESTING) - I reviewed patient records, labs, notes, testing and imaging myself where available.  Lab Results  Component Value Date   WBC 6.7 06/07/2015   HGB 13.1 06/07/2015   HCT 38.6 06/07/2015   MCV 94.6 06/07/2015    PLT 280 06/07/2015  Component Value Date/Time   NA 138 06/07/2015 0845   NA 141 10/01/2012 0919   K 4.0 06/07/2015 0845   K 4.0 10/01/2012 0919   CL 105 06/07/2015 0845   CL 108* 10/01/2012 0919   CO2 25 06/07/2015 0845   CO2 25 10/01/2012 0919   GLUCOSE 88 06/07/2015 0845   GLUCOSE 77 10/01/2012 0919   BUN 13 06/07/2015 0845   BUN 12 10/01/2012 0919   CREATININE 0.70 06/07/2015 0845   CREATININE 0.74 10/01/2012 0919   CALCIUM 8.9 06/07/2015 0845   CALCIUM 8.2* 10/01/2012 0919   PROT 6.9 06/07/2015 0845   PROT 7.1 10/01/2012 0919   ALBUMIN 4.1 06/07/2015 0845   ALBUMIN 3.7 10/01/2012 0919   AST 21 06/07/2015 0845   AST 19 10/01/2012 0919   ALT 18 06/07/2015 0845   ALT 19 10/01/2012 0919   ALKPHOS 95 06/07/2015 0845   ALKPHOS 91 10/01/2012 0919   BILITOT 0.6 06/07/2015 0845   BILITOT 0.5 10/01/2012 0919   GFRNONAA >60 06/07/2015 0845   GFRNONAA >60 10/01/2012 0919   GFRAA >60 06/07/2015 0845   GFRAA >60 10/01/2012 0919   Lab Results  Component Value Date   CHOL 168 10/01/2012   HDL 57 10/01/2012   LDLCALC 93 10/01/2012   TRIG 92 10/01/2012   Lab Results  Component Value Date   HGBA1C 5.1 06/07/2015   No results found for: [id number] Lab Results  Component Value Date   TSH 3.960 06/07/2015      ASSESSMENT AND PLAN 44 y.o. year old female  has a past medical history of Hypothyroidism; Hypersomnia with sleep apnea; Asthma; Allergic rhinitis; Anxiety; Tubular adenoma; Irritable bowel syndrome; GERD (gastroesophageal reflux disease); Persistent hypersomnia; and OSA (obstructive sleep apnea). here with:  1. Hypersomnia 2. Anxiety  The patient will continue taking Strattera 50 mg in a.m. I have encouraged the patient to begin taking this consistently. The patient can also take 25 mg of Strattera (one tablet) at lunchtime if needed. She verbalized understanding. She will continue taking Klonopin at bedtime. Patient advised that if her symptoms worsen or she  develops new symptoms she she'll let us know. She should also let us know she is unable to tolerate the additional dose of Strattera. I have again reviewed the side effects of Strattera with the patient. She verbalized understanding. She will follow-up in 3 months or sooner if needed.  I spent 25 minutes with the patient 50% of this time was counseling the patient and her diagnosis and treatment time.   Ward Givens, MSN, NP-C 07/14/2015, 11:34 AM [facility] [address] [place],  [address] [phone]

## 2015-07-14 NOTE — Progress Notes (Signed)
I agree with the assessment and plan as directed by NP .The patient is known to me .   Emalynn Clewis, MD  

## 2015-08-05 ENCOUNTER — Other Ambulatory Visit: Payer: Self-pay | Admitting: Neurology

## 2015-09-01 ENCOUNTER — Other Ambulatory Visit: Payer: Self-pay | Admitting: Obstetrics and Gynecology

## 2015-09-01 DIAGNOSIS — Z1231 Encounter for screening mammogram for malignant neoplasm of breast: Secondary | ICD-10-CM

## 2015-09-07 ENCOUNTER — Ambulatory Visit
Admission: RE | Admit: 2015-09-07 | Discharge: 2015-09-07 | Disposition: A | Discharge status: Home / Self Care | Payer: 59 | Source: Ambulatory Visit | Attending: Obstetrics and Gynecology | Admitting: Obstetrics and Gynecology

## 2015-09-07 DIAGNOSIS — R928 Other abnormal and inconclusive findings on diagnostic imaging of breast: Secondary | ICD-10-CM | POA: Insufficient documentation

## 2015-09-07 DIAGNOSIS — Z1231 Encounter for screening mammogram for malignant neoplasm of breast: Secondary | ICD-10-CM | POA: Diagnosis present

## 2015-09-12 ENCOUNTER — Other Ambulatory Visit: Payer: Self-pay | Admitting: Obstetrics and Gynecology

## 2015-09-12 DIAGNOSIS — R928 Other abnormal and inconclusive findings on diagnostic imaging of breast: Secondary | ICD-10-CM

## 2015-09-14 ENCOUNTER — Telehealth: Payer: Self-pay | Admitting: Adult Health

## 2015-09-14 MED ORDER — ATOMOXETINE HCL 25 MG PO CAPS: 50.0000 mg | ORAL_CAPSULE | Freq: Every morning | ORAL | Status: DC

## 2015-09-14 NOTE — Telephone Encounter (Signed)
Rx has been sent.  Receipt confirmed by pharmacy.   

## 2015-09-14 NOTE — Telephone Encounter (Signed)
Patient called to request refill of atomoxetine (STRATTERA) 25 MG capsule

## 2015-09-26 ENCOUNTER — Ambulatory Visit
Admission: RE | Admit: 2015-09-26 | Discharge: 2015-09-26 | Disposition: A | Discharge status: Home / Self Care | Payer: 59 | Source: Ambulatory Visit | Attending: Obstetrics and Gynecology | Admitting: Obstetrics and Gynecology

## 2015-09-26 DIAGNOSIS — N63 Unspecified lump in breast: Secondary | ICD-10-CM | POA: Diagnosis present

## 2015-09-26 DIAGNOSIS — R928 Other abnormal and inconclusive findings on diagnostic imaging of breast: Secondary | ICD-10-CM

## 2015-10-17 ENCOUNTER — Ambulatory Visit (INDEPENDENT_AMBULATORY_CARE_PROVIDER_SITE_OTHER): Payer: 59 | Admitting: Adult Health

## 2015-10-17 ENCOUNTER — Encounter: Payer: Self-pay | Admitting: Adult Health

## 2015-10-17 VITALS — BP 107/74 | HR 84 | Ht 63.0 in | Wt 188.0 lb

## 2015-10-17 DIAGNOSIS — G4710 Hypersomnia, unspecified: Secondary | ICD-10-CM | POA: Diagnosis not present

## 2015-10-17 NOTE — Progress Notes (Signed)
PATIENT: Heather Faulkner DOB: February 06, 1971  REASON FOR VISIT: follow up- hypersomnia HISTORY FROM: patient  HISTORY OF PRESENT ILLNESS: Heather Faulkner is a 44 year old female with a history of idiopathic hypersomnia as well as anxiety. She returns today for a follow-up. She is currently taking Strattera 50 mg in the morning and 25 mg at lunchtime if needed. Patient reports that this is working well for her. She states that since the last visit she is only has to take a third tablet 3 days. She states that this medication does not exacerbate her anxiety. She states other stimulants medication caused her anxiety to worsen. She does state that she is no longer employed. She states that her current insurance will run out of in the month. She is unsure if she will be able to afford Strattera after that. She is currently looking for a another job and is considering applying for Medicaid in the meantime. She returns today for an evaluation.  HISTORY 07/14/15: Heather Faulkner is a 44 year old female with a history of idiopathic hypersomnia as well as anxiety. She returns today for an evaluation. She was started on Strattera 50 mg in the morning. She reports that this has been beneficial. However she has not been taking it consistently. She states that it usually works well in the mornings however by the afternoons she begins to get sleepy again. She has also not been taking it on the weekends. She does report some fatigue. She states that the reason she's not taking it consistently is because some nights she feels that she sleeps well and will not need it. Although she now states that later in the day she wishes she would have taken it. She has been tolerating the medication well. Her blood pressure and heart rate are normal today. She has tried other stimulants in the past such as Ritalin, Nuvigil and Provigil but was unable to tolerate these medications. She denies any new neurological symptoms. She returns today for  an evaluation.  REVIEW OF SYSTEMS: Out of a complete 14 system review of symptoms, the patient complains only of the following symptoms, and all other reviewed systems are negative.  Frequent waking, snoring, depression, nervous/anxious  ALLERGIES: No Known Allergies  HOME MEDICATIONS: Outpatient Prescriptions Prior to Visit  Medication Sig Dispense Refill  . albuterol (VENTOLIN HFA) 108 (90 BASE) MCG/ACT inhaler Inhale into the lungs every 6 (six) hours as needed.     [name] atomoxetine (STRATTERA) 25 MG capsule Take 2 capsules (50 mg total) by mouth every morning. And may take one additional capsule at lunch if needed 90 capsule 3  . budesonide (RHINOCORT AQUA) 32 MCG/ACT nasal spray USE 2 SPRAYS IN EACH NOSTRIL DAILY    . clonazePAM (KLONOPIN) 0.5 MG tablet Take 0.5 mg by mouth. 1/2 tab bid and then 1 tablet po qhs prn    . etonogestrel (NEXPLANON) 68 MG IMPL implant 1 each by Subdermal route once.    . Fluticasone-Salmeterol (ADVAIR) 100-50 MCG/DOSE AEPB Inhale 1 puff into the lungs 2 (two) times daily.    [name] Hyoscyamine Sulfate 0.375 MG TBCR Take by mouth as needed.    [name] levocetirizine (XYZAL) 5 MG tablet Take 5 mg by mouth every evening.     [name] levothyroxine (SYNTHROID) 112 MCG tablet Take by mouth.    . montelukast (SINGULAIR) 10 MG tablet TAKE ONE TABLET BY MOUTH DAILY    . Probiotic Product (PROBIOTIC ADVANCED PO) Take by mouth daily.    [name] venlafaxine [facility])  75 MG tablet Take 75 mg by mouth at bedtime.    . benzonatate (TESSALON) 100 MG capsule Take 100 mg by mouth 3 (three) times daily as needed.     . ergocalciferol (VITAMIN D2) 50000 UNITS capsule Take 50,000 Units by mouth once a week.    . eszopiclone (LUNESTA) 2 MG TABS tablet Take 2 mg by mouth at bedtime as needed for sleep. Take immediately before bedtime    . predniSONE (DELTASONE) 10 MG tablet 6 tabs x 1 day, 5 tabs x 1 day, 4 tabs x 1 day, etc...    . triazolam (HALCION) 0.125 MG tablet Take 0.25 mg by mouth at bedtime as  needed.      No facility-administered medications prior to visit.    PAST MEDICAL HISTORY: Past Medical History  Diagnosis Date  . Hypothyroidism   . Hypersomnia with sleep apnea   . Asthma   . Allergic rhinitis   . Anxiety   . Tubular adenoma   . Irritable bowel syndrome   . GERD (gastroesophageal reflux disease)   . Persistent hypersomnia   . OSA (obstructive sleep apnea)     PAST SURGICAL HISTORY: Past Surgical History  Procedure Laterality Date  . Colonoscopy      x 2  . Cesarean      x 2  . Cholecystectomy    . Nasal surgery (polyps)      x2  . Bunionectomy Right     FAMILY HISTORY: Family History  Problem Relation Age of Onset  . Hypertension Mother   . Hyperlipidemia Mother   . Hypertension Father   . Diabetes Maternal Grandfather   . Heart attack Maternal Grandfather   . Stroke Paternal Grandmother   . Celiac disease Daughter     SOCIAL HISTORY: Social History   Social History  . Marital Status: Divorced    Spouse Name: N/A  . Number of Children: 2  . Years of Education: N/A   Occupational History  . Not on file.   Social History Main Topics  . Smoking status: Former Smoker    Types: Cigarettes  . Smokeless tobacco: Never Used     Comment: social in college  . Alcohol Use: 0.0 oz/week    0 Standard drinks or equivalent per week     Comment: occ 2-3 x week  . Drug Use: No  . Sexual Activity: Not on file   Other Topics Concern  . Not on file   Social History Narrative   Right handed, FT - Licensed clinical SW, (on FMLA), Masters SW, Caffeine 2-3 cups coffee, Divorced, 2 kids      PHYSICAL EXAM  Filed Vitals:   10/17/15 0851  BP: 107/74  Pulse: 84  Height: 5\' 3"  (1.6 m)  Weight: 188 lb (85.276 kg)   Body mass index is 33.31 kg/(m^2).  Generalized: Well developed, in no acute distress   Neurological examination  Mentation: Alert oriented to time, place, history taking. Follows all commands speech and language  fluent Cranial nerve II-XII: Pupils were equal round reactive to light. Extraocular movements were full, visual field were full on confrontational test. Facial sensation and strength were normal. Uvula tongue midline. Head turning and shoulder shrug  were normal and symmetric. Motor: The motor testing reveals 5 over 5 strength of all 4 extremities. Good symmetric motor tone is noted throughout.  Sensory: Sensory testing is intact to soft touch on all 4 extremities. No evidence of extinction is noted.  Coordination: Cerebellar testing  reveals good finger-nose-finger and heel-to-shin bilaterally.  Gait and station: Gait is normal. Tandem gait is normal. Romberg is negative. No drift is seen.  Reflexes: Deep tendon reflexes are symmetric and normal bilaterally.   DIAGNOSTIC DATA (LABS, IMAGING, TESTING) - I reviewed patient records, labs, notes, testing and imaging myself where available.    ASSESSMENT AND PLAN 44 y.o. year old female  has a past medical history of Hypothyroidism; Hypersomnia with sleep apnea; Asthma; Allergic rhinitis; Anxiety; Tubular adenoma; Irritable bowel syndrome; GERD (gastroesophageal reflux disease); Persistent hypersomnia; and OSA (obstructive sleep apnea). here with:  1. Hypersomnia  The patient is doing well on Strattera. She will continue this for now. We are reaching out to our pharmacy technician to try to help the patient get assistance with this medication. Patient advised that if her symptoms worsen or she develops new symptoms she should let us know. She will follow-up in 6 months or sooner if needed.  Ward Givens, MSN, NP-C 10/17/2015, 9:13 AM [facility] [address] [place], [place] [address] [phone]

## 2015-10-17 NOTE — Progress Notes (Signed)
I agree with the assessment and plan as directed by NP .The patient is known to me .   Rainn Zupko, MD  

## 2015-10-17 NOTE — Patient Instructions (Signed)
Continue Strattera.  We will notify Janett Billow to see if she can help with medication If your symptoms worsen or you develop new symptoms please let us know.

## 2015-10-18 ENCOUNTER — Telehealth: Payer: Self-pay

## 2015-10-18 NOTE — Telephone Encounter (Signed)
Clinic indicates the patient is interested in obtaining info regarding various assistance programs.  I called the patient, got no answer.  Left message. There are several programs available depending on the medication, and if it is generic or brand name.   Ventolin Brand Name Assistance contact is [phone] [place] Name Assistance contact is [phone] Rhinocort does not have an [place] does not have an [place] Name Assistance contact is [place] Name Assistance contact is [phone] Hyoscyamine does not have an [place] does not have an [place] does not have an [place] Name Assistance contact is [place] Name Assistance contact is [phone] Rx outreach can be contacted at [phone] for any generic medications (most, but not all generics are available through this program) There are also discount vouchers available for all medications at goodrx.com

## 2015-10-27 NOTE — Telephone Encounter (Signed)
Patient reached out to me today and I relayed info from previous portion of this encounter.  She expressed understanding and appreciation and will call us back if anything further is needed.

## 2016-01-31 ENCOUNTER — Other Ambulatory Visit: Payer: Self-pay | Admitting: Obstetrics and Gynecology

## 2016-01-31 DIAGNOSIS — N6001 Solitary cyst of right breast: Secondary | ICD-10-CM

## 2016-03-26 ENCOUNTER — Ambulatory Visit
Admission: RE | Admit: 2016-03-26 | Discharge: 2016-03-26 | Disposition: A | Discharge status: Home / Self Care | Payer: Self-pay | Source: Ambulatory Visit | Attending: Obstetrics and Gynecology | Admitting: Obstetrics and Gynecology

## 2016-03-26 DIAGNOSIS — N6001 Solitary cyst of right breast: Secondary | ICD-10-CM

## 2016-04-16 ENCOUNTER — Ambulatory Visit: Payer: 59 | Admitting: Adult Health

## 2016-06-07 ENCOUNTER — Other Ambulatory Visit: Payer: Self-pay | Admitting: Gastroenterology

## 2016-06-07 DIAGNOSIS — K219 Gastro-esophageal reflux disease without esophagitis: Secondary | ICD-10-CM

## 2016-06-07 DIAGNOSIS — R0989 Other specified symptoms and signs involving the circulatory and respiratory systems: Secondary | ICD-10-CM

## 2016-06-12 ENCOUNTER — Ambulatory Visit
Admission: RE | Admit: 2016-06-12 | Discharge: 2016-06-12 | Disposition: A | Discharge status: Home / Self Care | Payer: BLUE CROSS/BLUE SHIELD | Source: Ambulatory Visit | Attending: Gastroenterology | Admitting: Gastroenterology

## 2016-06-12 DIAGNOSIS — M5030 Other cervical disc degeneration, unspecified cervical region: Secondary | ICD-10-CM | POA: Diagnosis not present

## 2016-06-12 DIAGNOSIS — F458 Other somatoform disorders: Secondary | ICD-10-CM | POA: Insufficient documentation

## 2016-06-12 DIAGNOSIS — K219 Gastro-esophageal reflux disease without esophagitis: Secondary | ICD-10-CM | POA: Diagnosis not present

## 2016-06-12 DIAGNOSIS — R938 Abnormal findings on diagnostic imaging of other specified body structures: Secondary | ICD-10-CM | POA: Insufficient documentation

## 2016-06-12 DIAGNOSIS — R0989 Other specified symptoms and signs involving the circulatory and respiratory systems: Secondary | ICD-10-CM

## 2016-06-20 ENCOUNTER — Ambulatory Visit: Payer: Self-pay | Admitting: Urology

## 2016-07-06 ENCOUNTER — Ambulatory Visit: Payer: 59 | Admitting: Dietician

## 2016-07-20 ENCOUNTER — Encounter: Payer: BLUE CROSS/BLUE SHIELD | Attending: Gastroenterology | Admitting: Dietician

## 2016-07-20 VITALS — Ht 62.0 in | Wt 179.0 lb

## 2016-07-20 DIAGNOSIS — K219 Gastro-esophageal reflux disease without esophagitis: Secondary | ICD-10-CM

## 2016-07-20 DIAGNOSIS — K580 Irritable bowel syndrome with diarrhea: Secondary | ICD-10-CM

## 2016-07-20 DIAGNOSIS — E669 Obesity, unspecified: Secondary | ICD-10-CM

## 2016-07-20 NOTE — Patient Instructions (Signed)
   Choose low-fodmap foods, and eliminate high-fodmaps, especially the high-fructans foods to start. Monitor GI symptoms.   Continue to decrease caffeine; try 1/2-caff coffee, then go to decaf.   Drink plenty of water or lightly flavored waters and limit sodas. Try drinking a glass of water before eating a meal to help with fullness.   For weight loss, aim for 1300-1400 calories daily (not less than 1200, not more than 1500). Protein should be 6-7oz worth each daily, or 2 palm-size portions per day. Keep starchy foods to fist-size (1 cup) or less. Ideally 2/3 cup.   Relax before meal for a few minutes, drink water, then take small bites and chew thoroughly to help with portion control as well as digestion.   Try using a free app like MyFitnessPal or LoseIt to track your food intake.

## 2016-07-20 NOTE — Progress Notes (Signed)
Medical Nutrition Therapy: Visit start time: 0900  end time: 1020  Assessment:  Diagnosis: IBS, GERD, obesity Past medical history: hypothyroidism, asthma, allergic rhinitis Psychosocial issues/ stress concerns: history of depression, anxiety; patient states she is a stressful person Preferred learning method:  . Auditory . Visual  Current weight: 179.0lbs  Height: 5'2" Medications, supplements: reviewed list in chart with patient  Progress and evaluation: Patient reports chronic GI symptoms of cramping, bloating, gas, and some diarrhea. She states that GERD symptoms have also worsened recently. She has a daughter with celiac disease but has tested negative herself. She also would like to lose weight, states her weight has gradually increased in the past few years.    Physical activity: none  Dietary Intake:  Usual eating pattern includes 3 meals and 1-2 snacks per day. Dining out frequency: 5-6 meals per week.  Breakfast: fruit or granola bar/ breakfast bar, or light breakfast sandwich, occasionally cereal with some fiber and nuts: almond clusters; cheese grits. coffee Snack: none Lunch: sometimes snack like pretzels, fruit; or small portion chicken or tuna salad with crackers, leftover meat and veg., salad from [employer] or [employer], lean cuisine or weight watchers meal. Limits sandwiches, bread. Sometimes skips lunch due to busyness.  Snack: occasionally mixed nuts or peanut butter or nutella snack pack, baby bell cheese Supper: gf chicken tenders or pork chops or ham steak + vegetables corn broccoli, asparagus, salad (not often), tacos, spaghetti or pasta alfredo with GF pasta.  Snack: ice cream or popcorn, occasionally cookies; not daily Beverages: some water (not enough per patient), 1 regular coke or root beer or sprite daily, some Gatorade  Nutrition Care Education: Topics covered: IBS, Fodmap diet, GERD, weight management IBS: Fodmap diet, limiting gas-forming or stool-thinning  foods, including caffeine and carbonated beverages; low-fodmap sources of fiber.  GERD: Eating small amounts often, avoiding stress at mealtimes and eating slowly, chewing food well. High-acid foods to avoid. Weight control: benefits of weight control, behavioral changes for weight loss: strategies for portion control, basic kcal, carb, protein needs for weight loss, benefits of tracking food intake.  Other lifestyle changes:  benefits of regular exercise, importance of starting with short duration/ light intensity and gradually increasing.   Nutritional Diagnosis:  [place]-2.1 Inpaired nutrition utilization As related to IBS.  As evidenced by medical diagnosis, patient report. [place]-3.3 Overweight/obesity As related to inactivity, hypothyroidism, excess calories.  As evidenced by patient report.  Intervention: Instruction as noted above.   Set goals with patient direction.    She will call to schedule follow-up; wants to wait until after next MD appointment.   Education Materials given:  . IBS Diet ([facility]) . Fodmap food lists, grocery list . Low-fodmap fiber sources Proofreader) . Goals/ instructions  Learner/ who was taught:  . Patient   Level of understanding: [name] Verbalizes/ demonstrates competency  Demonstrated degree of understanding via:   Teach back Learning barriers: . None  Willingness to learn/ readiness for change: . Eager, change in progress  Monitoring and Evaluation:  Dietary intake, exercise, GI symptoms, and body weight      follow up: in 1 month(s) or 2 months.

## 2016-08-14 ENCOUNTER — Ambulatory Visit (INDEPENDENT_AMBULATORY_CARE_PROVIDER_SITE_OTHER): Payer: BLUE CROSS/BLUE SHIELD | Admitting: Adult Health

## 2016-08-14 ENCOUNTER — Encounter: Payer: Self-pay | Admitting: Adult Health

## 2016-08-14 VITALS — BP 125/83 | HR 80 | Ht 62.0 in | Wt 178.4 lb

## 2016-08-14 DIAGNOSIS — G4711 Idiopathic hypersomnia with long sleep time: Secondary | ICD-10-CM

## 2016-08-14 MED ORDER — ATOMOXETINE HCL 25 MG PO CAPS: 50.0000 mg | ORAL_CAPSULE | Freq: Every morning | ORAL | 5 refills | Status: DC

## 2016-08-14 NOTE — Progress Notes (Signed)
I agree with the assessment and plan as directed by NP .The patient is known to me .   Shaunice Levitan, MD  

## 2016-08-14 NOTE — Patient Instructions (Signed)
Continue Strattera 50 mg in the morning and 25 mg in the afternoon if needed If your symptoms worsen or you develop new symptoms please let us know.

## 2016-08-14 NOTE — Progress Notes (Signed)
PATIENT: Heather Faulkner DOB: 07-06-1971  REASON FOR VISIT: follow up- hypersomnia, anxiety HISTORY FROM: patient  HISTORY OF PRESENT ILLNESS: Heather Faulkner is a 45 year old female with a history of idiopathic hypersomnia and anxiety. She returns today for follow-up. She continues to take Strattera 50 mg in the morning and 25 mg at lunch. She reports that this is working well for her. 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 She reports that she is able to stay awake during the day to complete her job. She states occasionally in the mornings when driving her daughter to school she will feel slightly sleepy although this is not every morning. She states that she is seeing a new psychiatrist who put her on De[place]in. She denies any new neurological symptoms. He returns today for an evaluation.  HISTORY 10/17/15: Heather Faulkner is a 45 year old female with a history of idiopathic hypersomnia as well as anxiety. She returns today for a follow-up. She is currently taking Strattera 50 mg in the morning and 25 mg at lunchtime if needed. Patient reports that this is working well for her. She states that since the last visit she is only has to take a third tablet 3 days. She states that this medication does not exacerbate her anxiety. She states other stimulants medication caused her anxiety to worsen. She does state that she is no longer employed. She states that her current insurance will run out of in the month. She is unsure if she will be able to afford Strattera after that. She is currently looking for a another job and is considering applying for Medicaid in the meantime. She returns today for an evaluation.  HISTORY 07/14/15: Heather Faulkner is a 45 year old female with a history of idiopathic hypersomnia as well as anxiety. She returns today for an evaluation. She was started on Strattera 50 mg in the morning. She reports that this has been beneficial.  However she has not been taking it consistently. She states that it usually works well in the mornings however by the afternoons she begins to get sleepy again. She has also not been taking it on the weekends. She does report some fatigue. She states that the reason she's not taking it consistently is because some nights she feels that she sleeps well and will not need it. Although she now states that later in the day she wishes she would have taken it. She has been tolerating the medication well. Her blood pressure and heart rate are normal today. She has tried other stimulants in the past such as Ritalin, Nuvigil and Provigil but was unable to tolerate these medications. She denies any new neurological symptoms. She returns today for an evaluation.  REVIEW OF SYSTEMS: Out of a complete 14 system review of symptoms, the patient complains only of the following symptoms, and all other reviewed systems are negative.  Diarrhea, joint pain, depression, nervous/anxious, snoring, limb, itching, daytime sleepiness, diarrhea, cough, choking, environmental allergies  ALLERGIES: No Known Allergies  HOME MEDICATIONS: Outpatient Medications Prior to Visit  Medication Sig Dispense Refill  . albuterol (VENTOLIN HFA) 108 (90 BASE) MCG/ACT inhaler Inhale into the lungs every 6 (six) hours as needed.     [name] atomoxetine (STRATTERA) 25 MG capsule Take 2 capsules (50 mg total) by mouth every morning. And may take one additional capsule at lunch if needed 90 capsule 3  . budesonide (RHINOCORT AQUA) 32 MCG/ACT nasal spray USE 2 SPRAYS IN  EACH NOSTRIL DAILY    . clonazePAM (KLONOPIN) 0.5 MG tablet Take 0.5 mg by mouth. 1/2 tab bid and then 1 tablet po qhs prn    . etonogestrel (NEXPLANON) 68 MG IMPL implant 1 each by Subdermal route once.    . Fluticasone-Salmeterol (ADVAIR) 100-50 MCG/DOSE AEPB Inhale 1 puff into the lungs 2 (two) times daily.    [name] Hyoscyamine Sulfate 0.375 MG TBCR Take by mouth as needed.    [name]  levocetirizine (XYZAL) 5 MG tablet Take 5 mg by mouth every evening.     [name] levothyroxine (SYNTHROID) 112 MCG tablet Take by mouth.    . montelukast (SINGULAIR) 10 MG tablet TAKE ONE TABLET BY MOUTH DAILY    . Probiotic Product (PROBIOTIC ADVANCED PO) Take by mouth daily.    . ranitidine (ZANTAC) 150 MG capsule Take 150 mg by mouth 2 (two) times daily.    [name] venlafaxine (EFFEXOR) 75 MG tablet Take 75 mg by mouth at bedtime.     No facility-administered medications prior to visit.     PAST MEDICAL HISTORY: Past Medical History:  Diagnosis Date  . Allergic rhinitis   . Anxiety   . Asthma   . GERD (gastroesophageal reflux disease)   . Hypersomnia with sleep apnea   . Hypothyroidism   . Irritable bowel syndrome   . OSA (obstructive sleep apnea)   . Persistent hypersomnia   . Tubular adenoma     PAST SURGICAL HISTORY: Past Surgical History:  Procedure Laterality Date  . BUNIONECTOMY Right   . cesarean     x 2  . CHOLECYSTECTOMY    . COLONOSCOPY     x 2  . nasal surgery (polyps)     x2    FAMILY HISTORY: Family History  Problem Relation Age of Onset  . Hypertension Mother   . Hyperlipidemia Mother   . Hypertension Father   . Diabetes Maternal Grandfather   . Heart attack Maternal Grandfather   . Stroke Paternal Grandmother   . Celiac disease Daughter     SOCIAL HISTORY: Social History   Social History  . Marital status: Divorced    Spouse name: N/A  . Number of children: 2  . Years of education: N/A   Occupational History  . Not on file.   Social History Main Topics  . Smoking status: Former Smoker    Types: Cigarettes  . Smokeless tobacco: Never Used     Comment: social in college  . Alcohol use 0.0 oz/week     Comment: occ 2-3 x week  . Drug use: No  . Sexual activity: Not on file   Other Topics Concern  . 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  Generalized: Well developed, in no acute distress   Neurological examination  Mentation: Alert oriented to time, place, history taking. Follows all commands speech and language fluent Cranial nerve II-XII: Pupils were equal round reactive to light. Extraocular movements were full, visual field were full on confrontational test. Facial sensation and strength were normal. Uvula tongue midline. Head turning and shoulder shrug  were normal and symmetric. Motor: The motor testing reveals  5 over 5 strength of all 4 extremities. Good symmetric motor tone is noted throughout.  Sensory: Sensory testing is intact to soft touch on all 4 extremities. No evidence of extinction is noted.  Coordination: Cerebellar testing reveals good finger-nose-finger and heel-to-shin bilaterally.  Gait and station: Gait is normal.  Reflexes: Deep tendon reflexes are symmetric and normal bilaterally.   DIAGNOSTIC DATA (LABS, IMAGING, TESTING) - I reviewed patient records, labs, notes, testing and imaging myself where available.  Lab Results  Component Value Date   WBC 6.7 06/07/2015   HGB 13.1 06/07/2015   HCT 38.6 06/07/2015   MCV 94.6 06/07/2015   PLT 280 06/07/2015      Component Value Date/Time   NA 138 06/07/2015 0845   NA 141 10/01/2012 0919   K 4.0 06/07/2015 0845   K 4.0 10/01/2012 0919   CL 105 06/07/2015 0845   CL 108 (H) 10/01/2012 0919   CO2 25 06/07/2015 0845   CO2 25 10/01/2012 0919   GLUCOSE 88 06/07/2015 0845   GLUCOSE 77 10/01/2012 0919   BUN 13 06/07/2015 0845   BUN 12 10/01/2012 0919   CREATININE 0.70 06/07/2015 0845   CREATININE 0.74 10/01/2012 0919   CALCIUM 8.9 06/07/2015 0845   CALCIUM 8.2 (L) 10/01/2012 0919   PROT 6.9 06/07/2015 0845   PROT 7.1 10/01/2012 0919   ALBUMIN 4.1 06/07/2015 0845   ALBUMIN  3.7 10/01/2012 0919   AST 21 06/07/2015 0845   AST 19 10/01/2012 0919   ALT 18 06/07/2015 0845   ALT 19 10/01/2012 0919   ALKPHOS 95 06/07/2015 0845   ALKPHOS 91 10/01/2012 0919   BILITOT 0.6 06/07/2015 0845   BILITOT 0.5 10/01/2012 0919   GFRNONAA >60 06/07/2015 0845   GFRNONAA >60 10/01/2012 0919   GFRAA >60 06/07/2015 0845   GFRAA >60 10/01/2012 0919       ASSESSMENT AND PLAN 45 y.o. year old female  has a past medical history of Allergic rhinitis; Anxiety; Asthma; GERD (gastroesophageal reflux disease); Hypersomnia with sleep apnea; Hypothyroidism; Irritable bowel syndrome; OSA (obstructive sleep apnea); Persistent hypersomnia; and Tubular adenoma. here with:  1. Idiopathic hypersomnia  The patient will continue on Strattera 50 mg in the morning and 25 mg at lunch. Advised patient that if her symptoms worsen or she develops any new she should let us know. Follow-up in 6 months with Dr. Brett Fairy.     Ward Givens, MSN, NP-C 08/14/2016, 10:46 AM [facility] [address] [place], [place] [address] [phone]

## 2016-08-21 ENCOUNTER — Telehealth: Payer: Self-pay | Admitting: *Deleted

## 2016-08-21 NOTE — Telephone Encounter (Signed)
PA initiated thry cover my meds.  Approved for Atomoxetine HCL 25mg  caps 08-20-2016 thru 08-19-2017.  BCBS reference # [id number].  [phone].

## 2016-08-21 NOTE — Telephone Encounter (Signed)
LMVM for pt that got approval for Strattera thru [place].  Was not sure of pharmacy she was using for this drug.  Relayed that she can call and get medication and pharmacy can run it thru.  She is to call back if questions.

## 2016-09-14 ENCOUNTER — Other Ambulatory Visit: Payer: Self-pay | Admitting: Obstetrics and Gynecology

## 2016-09-14 DIAGNOSIS — Z1231 Encounter for screening mammogram for malignant neoplasm of breast: Secondary | ICD-10-CM

## 2016-09-21 ENCOUNTER — Encounter: Payer: Self-pay | Admitting: Radiology

## 2016-09-21 ENCOUNTER — Ambulatory Visit
Admission: RE | Admit: 2016-09-21 | Discharge: 2016-09-21 | Disposition: A | Discharge status: Home / Self Care | Payer: BLUE CROSS/BLUE SHIELD | Source: Ambulatory Visit | Attending: Obstetrics and Gynecology | Admitting: Obstetrics and Gynecology

## 2016-09-21 DIAGNOSIS — Z1231 Encounter for screening mammogram for malignant neoplasm of breast: Secondary | ICD-10-CM | POA: Diagnosis present

## 2016-10-28 ENCOUNTER — Other Ambulatory Visit: Payer: Self-pay | Admitting: Adult Health

## 2017-02-11 ENCOUNTER — Encounter: Payer: Self-pay | Admitting: Neurology

## 2017-02-11 ENCOUNTER — Ambulatory Visit (INDEPENDENT_AMBULATORY_CARE_PROVIDER_SITE_OTHER): Payer: BLUE CROSS/BLUE SHIELD | Admitting: Neurology

## 2017-02-11 VITALS — BP 137/86 | HR 85 | Resp 20 | Ht 63.0 in | Wt 179.0 lb

## 2017-02-11 DIAGNOSIS — G4710 Hypersomnia, unspecified: Secondary | ICD-10-CM | POA: Diagnosis not present

## 2017-02-11 DIAGNOSIS — G4711 Idiopathic hypersomnia with long sleep time: Secondary | ICD-10-CM

## 2017-02-11 DIAGNOSIS — R41840 Attention and concentration deficit: Secondary | ICD-10-CM | POA: Diagnosis not present

## 2017-02-11 MED ORDER — ATOMOXETINE HCL 25 MG PO CAPS: ORAL_CAPSULE | ORAL | 5 refills | Status: DC

## 2017-02-11 NOTE — Patient Instructions (Signed)
STRATTERA Atomoxetine capsules What is this medicine? ATOMOXETINE (AT oh mox e teen) is used to treat attention deficit/hyperactivity disorder, also known as ADHD. It is not a stimulant like other drugs for ADHD. This drug can improve attention span, concentration, and emotional control. It can also reduce restless or overactive behavior. This medicine may be used for other purposes; ask your health care provider or pharmacist if you have questions. COMMON BRAND NAME(S): Strattera What should I tell my health care provider before I take this medicine? They need to know if you have any of these conditions: -glaucoma -high or low blood pressure -history of stroke -irregular heartbeat or other cardiac disease -liver disease -mania or bipolar disorder -pheochromocytoma -suicidal thoughts -an unusual or allergic reaction to atomoxetine, other medicines, foods, dyes, or preservatives -pregnant or trying to get pregnant -breast-feeding How should I use this medicine? Take this medicine by mouth with a glass of water. Follow the directions on the prescription label. You can take it with or without food. If it upsets your stomach, take it with food. If you have difficulty sleeping and you take more than 1 dose per day, take your last dose before 6 PM. Take your medicine at regular intervals. Do not take it more often than directed. Do not stop taking except on your doctor's advice. A special MedGuide will be given to you by the pharmacist with each prescription and refill. Be sure to read this information carefully each time. Talk to your pediatrician regarding the use of this medicine in children. While this drug may be prescribed for children as young as 6 years for selected conditions, precautions do apply. Overdosage: If you think you have taken too much of this medicine contact a poison control center or emergency room at once. NOTE: This medicine is only for you. Do not share this medicine with  others. What if I miss a dose? If you miss a dose, take it as soon as you can. If it is almost time for your next dose, take only that dose. Do not take double or extra doses. What may interact with this medicine? Do not take this medicine with any of the following medications: -cisapride -dofetilide -dronedarone -MAOIs like Carbex, Eldepryl, Marplan, Nardil, and Parnate -pimozide -reboxetine -thioridazine -ziprasidone This medicine may also interact with the following medications: -certain medicines for blood pressure, heart disease, irregular heart beat -certain medicines for depression, anxiety, or psychotic disturbances -certain medicines for lung disease like albuterol -cold or allergy medicines -fluoxetine -medicines that increase blood pressure like dopamine, dobutamine, or ephedrine -other medicines that prolong the QT interval (cause an abnormal heart rhythm) -paroxetine -quinidine -stimulant medicines for attention disorders, weight loss, or to stay awake This list may not describe all possible interactions. Give your health care provider a list of all the medicines, herbs, non-prescription drugs, or dietary supplements you use. Also tell them if you smoke, drink alcohol, or use illegal drugs. Some items may interact with your medicine. What should I watch for while using this medicine? It may take a week or more for this medicine to take effect. This is why it is very important to continue taking the medicine and not miss any doses. If you have been taking this medicine regularly for some time, do not suddenly stop taking it. Ask your doctor or health care professional for advice. Rarely, this medicine may increase thoughts of suicide or suicide attempts in children and teenagers. Call your child's health care professional right away if your  child or teenager has new or increased thoughts of suicide or has changes in mood or behavior like becoming irritable or anxious. Regularly  monitor your child for these behavioral changes. For males, contact you doctor or health care professional right away if you have an erection that lasts longer than 4 hours or if it becomes painful. This may be a sign of serious problem and must be treated right away to prevent permanent damage. You may get drowsy or dizzy. Do not drive, use machinery, or do anything that needs mental alertness until you know how this medicine affects you. Do not stand or sit up quickly, especially if you are an older patient. This reduces the risk of dizzy or fainting spells. Alcohol can make you more drowsy and dizzy. Avoid alcoholic drinks. Do not treat yourself for coughs, colds or allergies without asking your doctor or health care professional for advice. Some ingredients can increase possible side effects. Your mouth may get dry. Chewing sugarless gum or sucking hard candy, and drinking plenty of water will help. What side effects may I notice from receiving this medicine? Side effects that you should report to your doctor or health care professional as soon as possible: -allergic reactions like skin rash, itching or hives, swelling of the face, lips, or tongue -breathing problems -chest pain -dark urine -fast, irregular heartbeat -general ill feeling or flu-like symptoms -high blood pressure -males: prolonged or painful erection -stomach pain or tenderness -trouble passing urine or change in the amount of urine -vomiting -weight loss -yellowing of the eyes or skin Side effects that usually do not require medical attention (report to your doctor or health care professional if they continue or are bothersome): -change in sex drive or performance -constipation or diarrhea -headache -loss of appetite -menstrual period irregularities -nausea -stomach upset This list may not describe all possible side effects. Call your doctor for medical advice about side effects. You may report side effects to FDA at  1-800-FDA-1088. Where should I keep my medicine? Keep out of the reach of children. Store at room temperature between 15 and 30 degrees C (59 and 86 degrees F). Throw away any unused medication after the expiration date. NOTE: This sheet is a summary. It may not cover all possible information. If you have questions about this medicine, talk to your doctor, pharmacist, or health care provider.  2018 Elsevier/Gold Standard (2014-03-26 15:29:22)

## 2017-02-11 NOTE — Progress Notes (Signed)
PATIENT: Heather Faulkner DOB: 10-Nov-1971  REASON FOR VISIT: follow up- hypersomnia, anxiety HISTORY FROM: patient  HISTORY OF PRESENT ILLNESS:  HISTORY 07/14/15: Heather Faulkner is a 46 year old female with a history of idiopathic hypersomnia as well as anxiety. She returns today for an evaluation. She was started on Strattera 50 mg in the morning. She reports that this has been beneficial. However she has not been taking it consistently. She states that it usually works well in the mornings however by the afternoons she begins to get sleepy again. She has also not been taking it on the weekends. She does report some fatigue. She states that the reason she's not taking it consistently is because some nights she feels that she sleeps well and will not need it. Although she now states that later in the day she wishes she would have taken it. She has been tolerating the medication well. Her blood pressure and heart rate are normal today. She has tried other stimulants in the past such as Ritalin, Nuvigil and Provigil but was unable to tolerate these medications. She denies any new neurological symptoms. She returns today for an evaluation.  Interval history from 02/11/2017. Heather Faulkner has last been seen by our nurse practitioner Ward Givens she continues to suffer from excessive daytime sleepiness and fatigue but has responded well to Depakote as vitamin supplement and Strattera 50 Mga day now. She has a new insurance coverage and had to undergo a preauthorization process. Dr. Brett Fairy prescribes the Strattera. She has done well on 50 mg , divided bid. I like to change it to 75 mg in AM today. She had failed modafinil and ritalin, as well as Adderall - all of  which caused palpitation, dumping syndrome, tremors and could not be tolerated.    REVIEW OF SYSTEMS: Out of a complete 14 system review of symptoms, the patient complains only of the following symptoms, and all other reviewed systems are  negative.  She reports problems with concentration and focus, easy distractibility, procrastination, sometimes impulsivity, fatigue severity score was endorsed at 45 points, Epworth sleepiness score is endorsed at 14 points. Palpitations.  Diarrhea,depression, nervous/anxious, snoring, limb, itching, fatigue  daytime sleepiness, diarrhea, cough, choking, environmental allergies  ALLERGIES: No Known Allergies  HOME MEDICATIONS: Outpatient Medications Prior to Visit  Medication Sig Dispense Refill  . albuterol (VENTOLIN HFA) 108 (90 BASE) MCG/ACT inhaler Inhale into the lungs every 6 (six) hours as needed.     [name] atomoxetine (STRATTERA) 25 MG capsule Take 2 capsules (50 mg total) by mouth every morning. And may take one additional capsule at lunch if needed 90 capsule 5  . budesonide (RHINOCORT AQUA) 32 MCG/ACT nasal spray USE 2 SPRAYS IN EACH NOSTRIL DAILY    . clonazePAM (KLONOPIN) 0.5 MG tablet Take 0.5 mg by mouth. 1/2 tab bid and then 1 tablet po qhs prn    . etonogestrel (NEXPLANON) 68 MG IMPL implant 1 each by Subdermal route once.    [name] L-Methylfolate-Algae (DEPLIN 15 PO) Take by mouth. One daily    . levocetirizine (XYZAL) 5 MG tablet Take 5 mg by mouth every evening.     [name] levothyroxine (SYNTHROID) 112 MCG tablet Take by mouth.    . montelukast (SINGULAIR) 10 MG tablet TAKE ONE TABLET BY MOUTH DAILY    . Probiotic Product (PROBIOTIC ADVANCED PO) Take by mouth daily.    [name] venlafaxine (EFFEXOR) 75 MG tablet Take 75 mg by mouth at bedtime.    . Fluticasone-Salmeterol (ADVAIR)  100-50 MCG/DOSE AEPB Inhale 1 puff into the lungs 2 (two) times daily.    [name] Hyoscyamine Sulfate 0.375 MG TBCR Take by mouth as needed.    . ranitidine (ZANTAC) 150 MG capsule Take 150 mg by mouth 2 (two) times daily.     No facility-administered medications prior to visit.     PAST MEDICAL HISTORY: Past Medical History:  Diagnosis Date  . Allergic rhinitis   . Anxiety   . Asthma   . GERD (gastroesophageal  reflux disease)   . Hypersomnia with sleep apnea   . Hypothyroidism   . Irritable bowel syndrome   . OSA (obstructive sleep apnea)   . Persistent hypersomnia   . Tubular adenoma     PAST SURGICAL HISTORY: Past Surgical History:  Procedure Laterality Date  . BUNIONECTOMY Right   . cesarean     x 2  . CHOLECYSTECTOMY    . COLONOSCOPY     x 2  . nasal surgery (polyps)     x2    FAMILY HISTORY: Family History  Problem Relation Age of Onset  . Hypertension Mother   . Hyperlipidemia Mother   . Hypertension Father   . Breast cancer Other   . Diabetes Maternal Grandfather   . Heart attack Maternal Grandfather   . Stroke Paternal Grandmother   . Celiac disease Daughter     SOCIAL HISTORY: Social History   Social History  . Marital status: Divorced    Spouse name: N/A  . Number of children: 2  . Years of education: N/A   Occupational History  . Not on file.   Social History Main Topics  . Smoking status: Former Smoker    Types: Cigarettes  . Smokeless tobacco: Never Used     Comment: social in college  . Alcohol use 0.0 oz/week     Comment: occ 2-3 x week  . Drug use: No  . Sexual activity: Not on file   Other Topics Concern  . Not on file   Social History Narrative   Right handed, FT - Licensed clinical SW, (on [employer]), Masters SW, Caffeine 2-3 cups coffee, Divorced, 2 kids      PHYSICAL EXAM  Vitals:   02/11/17 1030  BP: 137/86  Pulse: 85  Resp: 20  Weight: 179 lb (81.2 kg)  Height: 5\' 3"  (1.6 m)   Body mass index is 31.71 kg/m.  Generalized: Well developed, in no acute distress   Neurological examination  Mentation: Alert oriented to time, place, history taking. Follows all commands speech and language fluent Cranial nerve" no change in taste and smell sensation. Pupils were equal round reactive to light. Extraocular movements were full, visual field were full on confrontational test. Facial sensation and strength were normal. Uvula tongue  midline. Head turning and shoulder shrug  were normal and symmetric. Motor: 5 / 5 strength of all 4 extremities,  symmetric motor tone is noted throughout.  Sensory: intact to soft touch on all 4 extremities. Coordination: intact  finger-nose-finger test bilaterally.  Gait and station: Gait is normal.  Turns with 3 steps, steady gait, no abnormality of tandem , tip toe or hell walking.  Reflexes: Deep tendon reflexes are symmetric and normal bilaterally.   DIAGNOSTIC DATA (LABS, IMAGING, TESTING) - I reviewed patient records, labs, notes, testing and imaging myself where available.  Lab Results  Component Value Date   WBC 6.7 06/07/2015   HGB 13.1 06/07/2015   HCT 38.6 06/07/2015   MCV 94.6 06/07/2015  PLT 280 06/07/2015      Component Value Date/Time   NA 138 06/07/2015 0845   NA 141 10/01/2012 0919   K 4.0 06/07/2015 0845   K 4.0 10/01/2012 0919   CL 105 06/07/2015 0845   CL 108 (H) 10/01/2012 0919   CO2 25 06/07/2015 0845   CO2 25 10/01/2012 0919   GLUCOSE 88 06/07/2015 0845   GLUCOSE 77 10/01/2012 0919   BUN 13 06/07/2015 0845   BUN 12 10/01/2012 0919   CREATININE 0.70 06/07/2015 0845   CREATININE 0.74 10/01/2012 0919   CALCIUM 8.9 06/07/2015 0845   CALCIUM 8.2 (L) 10/01/2012 0919   PROT 6.9 06/07/2015 0845   PROT 7.1 10/01/2012 0919   ALBUMIN 4.1 06/07/2015 0845   ALBUMIN 3.7 10/01/2012 0919   AST 21 06/07/2015 0845   AST 19 10/01/2012 0919   ALT 18 06/07/2015 0845   ALT 19 10/01/2012 0919   ALKPHOS 95 06/07/2015 0845   ALKPHOS 91 10/01/2012 0919   BILITOT 0.6 06/07/2015 0845   BILITOT 0.5 10/01/2012 0919   GFRNONAA >60 06/07/2015 0845   GFRNONAA >60 10/01/2012 0919   GFRAA >60 06/07/2015 0845   GFRAA >60 10/01/2012 0919       ASSESSMENT AND PLAN 46 y.o. year old female  has a past medical history of Allergic rhinitis; Anxiety; Asthma; GERD (gastroesophageal reflux disease); Hypersomnia with sleep apnea; Hypothyroidism; Irritable bowel syndrome; OSA  (obstructive sleep apnea); Persistent hypersomnia; and Tubular adenoma. here with:  1. Idiopathic hypersomnia treated with Strattera.  2. Sleep hygiene is poor- she is trying to change bedtime and TV/Phone habits. 3. Patient suspects a ADHD, please discuss with [facility].   The patient will continue on Strattera but will take 75 mg daily now.   Advised patient that if her symptoms worsen or she develops any new she should let us know.  Follow-up in 6 months with NP Ward Givens, NP.  Larey Seat, MD  02/11/2017, 10:30 AM [facility] [address], [place], [place] [address] [phone]

## 2017-05-28 ENCOUNTER — Other Ambulatory Visit: Payer: Self-pay

## 2017-06-19 ENCOUNTER — Encounter: Payer: Self-pay | Admitting: Urology

## 2017-06-19 ENCOUNTER — Ambulatory Visit (INDEPENDENT_AMBULATORY_CARE_PROVIDER_SITE_OTHER): Payer: BLUE CROSS/BLUE SHIELD | Admitting: Urology

## 2017-06-19 VITALS — BP 111/75 | HR 81 | Ht 63.0 in | Wt 170.0 lb

## 2017-06-19 DIAGNOSIS — R3129 Other microscopic hematuria: Secondary | ICD-10-CM | POA: Diagnosis not present

## 2017-06-19 LAB — URINALYSIS, COMPLETE
Bilirubin, UA: NEGATIVE
Glucose, UA: NEGATIVE
Ketones, UA: NEGATIVE
Leukocytes, UA: NEGATIVE
NITRITE UA: NEGATIVE
PH UA: 5.5 (ref 5.0–7.5)
Protein, UA: NEGATIVE
Specific Gravity, UA: 1.025 (ref 1.005–1.030)
UUROB: 0.2 mg/dL (ref 0.2–1.0)

## 2017-06-19 LAB — MICROSCOPIC EXAMINATION
Bacteria, UA: NONE SEEN
WBC, UA: NONE SEEN /hpf (ref 0–?)

## 2017-06-19 NOTE — Progress Notes (Addendum)
06/19/2017 3:00 PM   GLENOLA WHEAT 05-25-1971 [id number]  Referring provider: Tracie Harrier, MD [address] [facility] [place], [place] [address]  Chief Complaint  Patient presents with  . Hematuria    New patient    HPI: 46 year old female referred for further evaluation of microscopic hematuria. She underwent routine urinalysis by her PCP on 05/17/2017 found to have 4-10 red blood cells per high-powered field. It also had moderate bacteria and crystals present. This is also present on urinalysis on in [address] as well as 2015.  UA not provided today she is currently on her menstrual cycle.  She denies any flank pain or gross hematuria. No personal history of kidney stones.  She denies any urinary symptoms including no frequency, urgency, urge incontinence.  No history of UTIs.  She did smoke socially in college but no significant smoking history. No exposures to secondhand smoke. No exposures to aniline dye or bending.  She does have occasional low back pain and diffuse abdominal pain which she relates to IBS.  No recent renal or abdominopelvic imaging.  She does note that her mother has a history of microscopic hematuria. No family history of GU malignancy.   PMH: Past Medical History:  Diagnosis Date  . Allergic rhinitis   . Anxiety   . Asthma   . GERD (gastroesophageal reflux disease)   . Hypersomnia with sleep apnea   . Hypothyroidism   . Irritable bowel syndrome   . OSA (obstructive sleep apnea)   . Persistent hypersomnia   . Tubular adenoma     Surgical History: Past Surgical History:  Procedure Laterality Date  . BUNIONECTOMY Right   . cesarean     x 2  . CHOLECYSTECTOMY    . COLONOSCOPY     x 2  . nasal surgery (polyps)     x2    Home Medications:  Allergies as of 06/19/2017   No Known Allergies     Medication List       Accurate as of 06/19/17  3:00 PM. Always use your most recent med list.          atomoxetine  25 MG capsule Commonly known as:  STRATTERA Take 3 capsules in AM , 75 mg total.   budesonide 32 MCG/ACT nasal spray Commonly known as:  RHINOCORT AQUA USE 2 SPRAYS IN EACH NOSTRIL DAILY   clonazePAM 0.5 MG tablet Commonly known as:  KLONOPIN Take 0.5 mg by mouth. 1/2 tab bid and then 1 tablet po qhs prn   montelukast 10 MG tablet Commonly known as:  SINGULAIR TAKE ONE TABLET BY MOUTH DAILY   NEXPLANON 68 MG Impl implant Generic drug:  etonogestrel 1 each by Subdermal route once.   PROBIOTIC ADVANCED PO Take by mouth daily.   SYNTHROID 112 MCG tablet Generic drug:  levothyroxine TAKE 1 TAB BY MOUTH DAILY. TAKE ON AN EMPTY STOMACH W/ GLASS OF WATER AT LEAST 30-60MIN B4 BREAKFAST   VENTOLIN HFA 108 (90 Base) MCG/ACT inhaler Generic drug:  albuterol Inhale into the lungs every 6 (six) hours as needed.   Vitamin D (Ergocalciferol) 50000 units Caps capsule Commonly known as:  DRISDOL TAKE 1 CAPSULE (50,000 UNITS TOTAL) BY MOUTH ONCE A WEEK.       Allergies: No Known Allergies  Family History: Family History  Problem Relation Age of Onset  . Hypertension Mother   . Hyperlipidemia Mother   . Hypertension Father   . Breast cancer Other   . Diabetes  Maternal Grandfather   . Heart attack Maternal Grandfather   . Stroke Paternal Grandmother   . Celiac disease Daughter     Social History:  reports that she has quit smoking. Her smoking use included Cigarettes. She has never used smokeless tobacco. She reports that she drinks alcohol. She reports that she does not use drugs.  ROS: UROLOGY Frequent Urination?: Yes Hard to postpone urination?: No Burning/pain with urination?: No Get up at night to urinate?: No Leakage of urine?: No Urine stream starts and stops?: No Trouble starting stream?: No Do you have to strain to urinate?: No Blood in urine?: Yes Urinary tract infection?: No Sexually transmitted disease?: No Injury to kidneys or bladder?: No Painful  intercourse?: No Weak stream?: No Currently pregnant?: No Vaginal bleeding?: No Last menstrual period?: 06/17/2017  Gastrointestinal Nausea?: No Vomiting?: No Indigestion/heartburn?: No Diarrhea?: Yes Constipation?: No  Constitutional Fever: No Night sweats?: No Weight loss?: No Fatigue?: Yes  Skin Skin rash/lesions?: No Itching?: No  Eyes Blurred vision?: No Double vision?: No  Ears/Nose/Throat Sore throat?: No Sinus problems?: Yes  Hematologic/Lymphatic Swollen glands?: No Easy bruising?: Yes  Cardiovascular Leg swelling?: No Chest pain?: No  Respiratory Cough?: Yes Shortness of breath?: No  Endocrine Excessive thirst?: No  Musculoskeletal Back pain?: No Joint pain?: Yes  Neurological Headaches?: No Dizziness?: No  Psychologic Depression?: Yes Anxiety?: Yes  Physical Exam: BP 111/75   Pulse 81   Ht 5\' 3"  (1.6 m)   Wt 170 lb (77.1 kg)   LMP 06/16/2017 (Approximate)   BMI 30.11 kg/m   Constitutional:  Alert and oriented, No acute distress. HEENT: [place] AT, moist mucus membranes.  Trachea midline, no masses. Cardiovascular: No clubbing, cyanosis, or edema. Respiratory: Normal respiratory effort, no increased work of breathing. GI: Abdomen is soft, nontender, nondistended, no abdominal masses GU: No CVA tenderness.  Skin: No rashes, bruises or suspicious lesions. Neurologic: Grossly intact, no focal deficits, moving all 4 extremities. Psychiatric: Normal mood and affect.  Laboratory Data: Cr 0.8 on 05/17/17  Urinalysis UA reviewed, + RBC 4-10 on 05/17/17, 05/22/16, 11/30/15 and 12/29/4  Pertinent Imaging: n/a  Assessment & Plan:    1. Microscopic hematuria We discussed the differential diagnosis for microscopic hematuria including nephrolithiasis, renal or upper tract tumors, bladder stones, UTIs, or bladder tumors as well as undetermined etiologies. Per AUA guidelines, I did recommend complete microscopic hematuria evaluation including  CTU, possible urine cytology, and office cystoscopy.  - Urinalysis, Complete   Return in about 4 weeks (around 07/17/2017) for cytoscopy, f/u CT urogram.  Hollice Espy, MD  [facility] [address] [place], [place] [address] [phone]

## 2017-06-25 ENCOUNTER — Telehealth: Payer: Self-pay | Admitting: Urology

## 2017-06-25 NOTE — Telephone Encounter (Signed)
Patient called today and she was called by [facility] regarding her CT scan that you ordered. They told he that if she went to [facility] in [place] that it would be cheaper for her to have it done there. She wants to now go there to have her Ct scan done. I have reached out to [facility] and asked about being able to view the images thru [facility] and they said they don't use it but they do use Epic, not sure if it's the same one that we use. They gave me the name of a lady to call tomorrow and talk to to verify if we will be able to see them on Epic or not. If not they said they can put them on a CD the same day and send with the patient.  I will need an order for the CT scan written out. I will write it out if you will sign it.   Thanks,  Sharyn Lull

## 2017-06-26 NOTE — Telephone Encounter (Signed)
LMOM

## 2017-06-26 NOTE — Telephone Encounter (Signed)
Please inform her about our experience with outside images, often done incorrectly, occasionally have to be repeated, and will not be able to use an future for comparison.  Additionally, I won't able to discuss this with our radiologist colleagues if there are any worrisome finding.  If she finds it necessary to go outside to have the study done, please provide her with an order.  Order should say CT abd/ pelvis with and without contrast and with delayed imaging (CT urogram)  Hollice Espy, MD

## 2017-06-27 NOTE — Telephone Encounter (Signed)
LMOM

## 2017-06-27 NOTE — Telephone Encounter (Signed)
Spoke with pt in reference to potential problems with have CT at an outside source. Pt stated that she would call [facility] to find out how much CT would cost her. Pt then stated that after she makes a decision she would call back.

## 2017-06-28 ENCOUNTER — Telehealth: Payer: Self-pay

## 2017-06-28 NOTE — Telephone Encounter (Signed)
Pt returned call stating she would like to have CT done at [facility]. What do I need to do from here?

## 2017-07-09 ENCOUNTER — Ambulatory Visit
Admission: RE | Admit: 2017-07-09 | Discharge: 2017-07-09 | Disposition: A | Discharge status: Home / Self Care | Payer: BLUE CROSS/BLUE SHIELD | Source: Ambulatory Visit | Attending: Urology | Admitting: Urology

## 2017-07-09 DIAGNOSIS — R3129 Other microscopic hematuria: Secondary | ICD-10-CM | POA: Insufficient documentation

## 2017-07-09 DIAGNOSIS — R935 Abnormal findings on diagnostic imaging of other abdominal regions, including retroperitoneum: Secondary | ICD-10-CM | POA: Insufficient documentation

## 2017-07-09 MED ORDER — IOPAMIDOL (ISOVUE-300) INJECTION 61%
125.0000 mL | Freq: Once | INTRAVENOUS | Status: AC | PRN
  Administered 2017-07-09: 125 mL via INTRAVENOUS

## 2017-07-11 ENCOUNTER — Telehealth: Payer: Self-pay | Admitting: Urology

## 2017-07-11 NOTE — Telephone Encounter (Signed)
Patient received your message and is returning your call regarding incidental findings on her CT scan.  She can be reached at [phone].

## 2017-07-12 NOTE — Telephone Encounter (Signed)
Called back again, unable to reach, [facility].  Hollice Espy, MD

## 2017-07-16 NOTE — Telephone Encounter (Signed)
Discuss incidental findings on CT scan regarding stomach. She does report that she's been having issues with bloating and gas and does regularly see a gastroenterologist. She's not had upper endoscopy in over 5 years. She will call Dr. Gustavo Lah today to arrange f/u.       IMPRESSION: 1. A cause for hematuria is not identified. 2. Unusual deposition of gas along the nondependent wall of the stomach, without gastric wall thickening, portal venous gas, or small bowel pneumatosis. By all accounts the patient is not acutely ill during this outpatient imaging exam, and accordingly I am not suspicious for emphysematous gastritis or other fulminant causes for gas in the stomach wall. Also, looking back at the chest CT from 2014, there was frothy fluid in the stomach with some gas deposition along the gastric wall as well, I suspect that some of the appearance may well be dietary or simply due to gas collecting along the rugal folds, rather than necessarily from gastric pneumatosis. If the patient has occult GI bleeding or other symptoms which may be related to the stomach, upper endoscopy may be warranted. I reviewed this appearance with Dr. Kalman Jewels, who concurred. 3. 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 4. Suspected small bilateral ovarian cysts. These results will be called to the ordering clinician or representative by the Radiologist Assistant, and communication documented in the PACS or zVision Dashboard.   Electronically Signed   By: Van Clines M.D.   On: 07/09/2017 19:13   Hollice Espy, MD

## 2017-07-17 ENCOUNTER — Ambulatory Visit (INDEPENDENT_AMBULATORY_CARE_PROVIDER_SITE_OTHER): Payer: BLUE CROSS/BLUE SHIELD | Admitting: Urology

## 2017-07-17 ENCOUNTER — Encounter: Payer: Self-pay | Admitting: Urology

## 2017-07-17 VITALS — BP 116/78 | HR 76 | Ht 63.0 in | Wt 171.0 lb

## 2017-07-17 DIAGNOSIS — R3129 Other microscopic hematuria: Secondary | ICD-10-CM | POA: Diagnosis not present

## 2017-07-17 LAB — URINALYSIS, COMPLETE
Bilirubin, UA: NEGATIVE
Glucose, UA: NEGATIVE
Ketones, UA: NEGATIVE
NITRITE UA: NEGATIVE
PH UA: 5.5 (ref 5.0–7.5)
Protein, UA: NEGATIVE
Specific Gravity, UA: >1.03 — ABNORMAL HIGH (ref 1.005–1.030)
UUROB: 0.2 mg/dL (ref 0.2–1.0)

## 2017-07-17 LAB — MICROSCOPIC EXAMINATION

## 2017-07-17 MED ORDER — LIDOCAINE HCL 2 % EX GEL
1.0000 "application " | Freq: Once | CUTANEOUS | Status: AC
  Administered 2017-07-17: 1 via URETHRAL

## 2017-07-17 MED ORDER — CIPROFLOXACIN HCL 500 MG PO TABS
500.0000 mg | ORAL_TABLET | Freq: Once | ORAL | Status: AC
  Administered 2017-07-17: 500 mg via ORAL

## 2017-07-17 NOTE — Progress Notes (Signed)
   07/17/17  CC:  Chief Complaint  Patient presents with  . Cysto    HPI: 46 year old female with microscopic hematuria who presents today for cystoscopy to complete her workup.  She underwent CT urogram earlier this month revealing incidental stomach findings. She has a follow-up with GI to discuss this further.  No GU pathology identified.  Blood pressure 116/78, pulse 76, height 5\' 3"  (1.6 m), weight 171 lb (77.6 kg). NED. A&Ox3.   No respiratory distress   Abd soft, NT, ND Normal external genitalia with patent urethral meatus  Cystoscopy Procedure Note  Patient identification was confirmed, informed consent was obtained, and patient was prepped using Betadine solution.  Lidocaine jelly was administered per urethral meatus.    Preoperative abx where received prior to procedure.    Procedure: - Flexible cystoscope introduced, without any difficulty.   - Thorough search of the bladder revealed:    normal urethral meatus    normal urothelium    no stones    no ulcers     no tumors    no urethral polyps    no trabeculation  - Ureteral orifices were normal in position and appearance.  Post-Procedure: - Patient tolerated the procedure well  Assessment/ Plan:  1. Microscopic hematuria S/p negative work up Cysto today unremarkable CT urogram reviewed- advised to f/u with GI - Urinalysis, Complete - lidocaine (XYLOCAINE) 2 % jelly 1 application; Place 1 application into the urethra once. - ciprofloxacin (CIPRO) tablet 500 mg; Take 1 tablet (500 mg total) by mouth once. e  Hollice Espy, MD

## 2017-07-30 ENCOUNTER — Other Ambulatory Visit: Payer: Self-pay | Admitting: Gastroenterology

## 2017-07-30 DIAGNOSIS — K769 Liver disease, unspecified: Secondary | ICD-10-CM

## 2017-08-05 ENCOUNTER — Ambulatory Visit: Payer: BLUE CROSS/BLUE SHIELD

## 2017-08-06 ENCOUNTER — Ambulatory Visit
Admission: RE | Admit: 2017-08-06 | Discharge: 2017-08-06 | Disposition: A | Discharge status: Home / Self Care | Payer: BLUE CROSS/BLUE SHIELD | Source: Ambulatory Visit | Attending: Gastroenterology | Admitting: Gastroenterology

## 2017-08-06 ENCOUNTER — Other Ambulatory Visit
Admission: RE | Admit: 2017-08-06 | Discharge: 2017-08-06 | Disposition: A | Discharge status: Home / Self Care | Payer: BLUE CROSS/BLUE SHIELD | Source: Ambulatory Visit | Attending: Gastroenterology | Admitting: Gastroenterology

## 2017-08-06 DIAGNOSIS — N9489 Other specified conditions associated with female genital organs and menstrual cycle: Secondary | ICD-10-CM | POA: Diagnosis not present

## 2017-08-06 DIAGNOSIS — K769 Liver disease, unspecified: Secondary | ICD-10-CM

## 2017-08-06 DIAGNOSIS — K7689 Other specified diseases of liver: Secondary | ICD-10-CM | POA: Insufficient documentation

## 2017-08-06 MED ORDER — GADOXETATE DISODIUM 0.25 MMOL/ML IV SOLN
10.0000 mL | Freq: Once | INTRAVENOUS | Status: AC | PRN
  Administered 2017-08-06: 8 mL via INTRAVENOUS

## 2017-08-14 ENCOUNTER — Ambulatory Visit: Payer: BLUE CROSS/BLUE SHIELD | Admitting: Adult Health

## 2017-08-14 LAB — MISCELLANEOUS TEST

## 2017-09-16 ENCOUNTER — Telehealth: Payer: Self-pay | Admitting: Neurology

## 2017-09-16 NOTE — Telephone Encounter (Signed)
PA completed for Atomoxetine thru [place]. May take up to 3 buisness days. KEY: [id number]

## 2017-09-18 NOTE — Telephone Encounter (Signed)
PA approved for the patient for the Atomoxetine HCL 25mg  caps. Approved for 09/16/2017-09/14/2020. REF # [id number]

## 2017-11-06 ENCOUNTER — Other Ambulatory Visit: Payer: Self-pay | Admitting: Obstetrics & Gynecology

## 2017-11-06 ENCOUNTER — Other Ambulatory Visit: Payer: Self-pay | Admitting: Obstetrics and Gynecology

## 2017-11-06 DIAGNOSIS — Z1231 Encounter for screening mammogram for malignant neoplasm of breast: Secondary | ICD-10-CM

## 2017-11-12 ENCOUNTER — Ambulatory Visit: Payer: BLUE CROSS/BLUE SHIELD | Admitting: Adult Health

## 2017-12-03 ENCOUNTER — Ambulatory Visit
Admission: RE | Admit: 2017-12-03 | Discharge: 2017-12-03 | Disposition: A | Discharge status: Home / Self Care | Payer: BLUE CROSS/BLUE SHIELD | Source: Ambulatory Visit | Attending: Obstetrics & Gynecology | Admitting: Obstetrics & Gynecology

## 2017-12-03 DIAGNOSIS — Z1231 Encounter for screening mammogram for malignant neoplasm of breast: Secondary | ICD-10-CM | POA: Diagnosis not present

## 2018-02-26 ENCOUNTER — Encounter (INDEPENDENT_AMBULATORY_CARE_PROVIDER_SITE_OTHER): Payer: Self-pay

## 2018-02-26 ENCOUNTER — Ambulatory Visit: Payer: BLUE CROSS/BLUE SHIELD | Admitting: Adult Health

## 2018-02-26 ENCOUNTER — Encounter: Payer: Self-pay | Admitting: Adult Health

## 2018-02-26 VITALS — BP 122/83 | HR 80 | Ht 63.0 in | Wt 166.8 lb

## 2018-02-26 DIAGNOSIS — G4711 Idiopathic hypersomnia with long sleep time: Secondary | ICD-10-CM | POA: Diagnosis not present

## 2018-02-26 DIAGNOSIS — R200 Anesthesia of skin: Secondary | ICD-10-CM

## 2018-02-26 MED ORDER — ATOMOXETINE HCL 25 MG PO CAPS: ORAL_CAPSULE | ORAL | 5 refills | Status: DC

## 2018-02-26 NOTE — Progress Notes (Signed)
I agree with the assessment and plan as directed by NP .The patient is known to me .   Alonso Gapinski, MD  

## 2018-02-26 NOTE — Patient Instructions (Signed)
Your Plan:  Continue Strattera 75 mg daily If your symptoms worsen or you develop new symptoms please let us know.    Thank you for coming to see [place] at [facility]. I hope we have been able to provide you high quality care today.  You may receive a patient satisfaction survey over the next few weeks. We would appreciate your feedback and comments so that we may continue to improve ourselves and the health of our patients.

## 2018-02-26 NOTE — Progress Notes (Signed)
PATIENT: Heather Faulkner DOB: [birth year]  REASON FOR VISIT: follow up HISTORY FROM: patient  HISTORY OF PRESENT ILLNESS: Today 02/26/18 Heather Faulkner is a 47 year old female with a history of idiopathic hypersomnia and anxiety.  She returns today for follow-up.  She is currently taking Strattera 75 mg daily.  She reports that this continues to work well for her.  She denies any difficulty with staying awake during the day.  She continues to see her psychiatrist.  She does report some numbness in the right hand when she wakes ups. She reports that it does not occur every morning but is fairly consistent.  She does  a lot of typing on a computer.  She feels that this may be carpal tunnel syndrome.  She has never tried wearing a wrist brace.  She also reports a mild tremor in both hands typically while she is typing.  She reports that this is not consistent.  Reports that she has not paid enough attention to notice a pattern or potential trigger.  She also reports that she has trouble with her long-term memory.  Reports that she has trouble remembering events that happened in her childhood or even events that happened 2 or 3 years ago.  Denies any changes with her short-term memory.  She returns today for an evaluation.  HISTORY 08/14/16: Heather Faulkner is a 47 year old female with a history of idiopathic hypersomnia and anxiety. She returns today for follow-up. She continues to take Strattera 50 mg in the morning and 25 mg at lunch. She reports that this is working well for her. 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 She reports that she is able to stay awake during the day to complete her job. She states occasionally in the mornings when driving her daughter to school she will feel slightly sleepy although this is not every morning. She states that she is seeing a new psychiatrist who put her on [place]. She denies any new neurological symptoms. He  returns today for an evaluation.    REVIEW OF SYSTEMS: Out of a complete 14 system review of symptoms, the patient complains only of the following symptoms, and all other reviewed systems are negative.  Cough, memory loss, numbness, tremors, joint pain, joint swelling, neck stiffness, depression, nervous/anxious, rash  FSS 29, Epworth 8  ALLERGIES: No Known Allergies  HOME MEDICATIONS: Outpatient Medications Prior to Visit  Medication Sig Dispense Refill  . albuterol (VENTOLIN HFA) 108 (90 BASE) MCG/ACT inhaler Inhale into the lungs every 6 (six) hours as needed.     [name] atomoxetine (STRATTERA) 25 MG capsule Take 3 capsules in AM , 75 mg total. 90 capsule 5  . budesonide (RHINOCORT AQUA) 32 MCG/ACT nasal spray USE 2 SPRAYS IN EACH NOSTRIL DAILY    . clonazePAM (KLONOPIN) 0.5 MG tablet Take 0.5 mg by mouth. 1/2 tab bid and then 1 tablet po qhs prn    . etonogestrel (NEXPLANON) 68 MG IMPL implant 1 each by Subdermal route once.    . montelukast (SINGULAIR) 10 MG tablet TAKE ONE TABLET BY MOUTH DAILY    . Probiotic Product (PROBIOTIC ADVANCED PO) Take by mouth daily.    [name] SYNTHROID 112 MCG tablet TAKE 1 TAB BY MOUTH DAILY. TAKE ON AN EMPTY STOMACH W/ GLASS OF WATER AT LEAST 30-60MIN B4 BREAKFAST  1  . Vitamin D, Ergocalciferol, (DRISDOL) 50000 units CAPS capsule TAKE 1 CAPSULE (50,000 UNITS TOTAL) BY MOUTH ONCE  A WEEK.  3   No facility-administered medications prior to visit.     PAST MEDICAL HISTORY: Past Medical History:  Diagnosis Date  . Allergic rhinitis   . Anxiety   . Asthma   . GERD (gastroesophageal reflux disease)   . Hypersomnia with sleep apnea   . Hypothyroidism   . Irritable bowel syndrome   . OSA (obstructive sleep apnea)   . Persistent hypersomnia   . Tubular adenoma     PAST SURGICAL HISTORY: Past Surgical History:  Procedure Laterality Date  . BUNIONECTOMY Right   . cesarean     x 2  . CHOLECYSTECTOMY    . COLONOSCOPY     x 2  . nasal surgery  (polyps)     x2    FAMILY HISTORY: Family History  Problem Relation Age of Onset  . Hypertension Mother   . Hyperlipidemia Mother   . Hypertension Father   . Breast cancer Other   . Diabetes Maternal Grandfather   . Heart attack Maternal Grandfather   . Stroke Paternal Grandmother   . Celiac disease Daughter     SOCIAL HISTORY: Social History   Socioeconomic History  . Marital status: Divorced    Spouse name: Not on file  . Number of children: 2  . Years of education: Not on file  . Highest education level: Not on file  Occupational History  . Not on file  Social Needs  . Financial resource strain: Not on file  . Food insecurity:    Worry: Not on file    Inability: Not on file  . Transportation needs:    Medical: Not on file    Non-medical: Not on file  Tobacco Use  . Smoking status: Former Smoker    Types: Cigarettes  . Smokeless tobacco: Never Used  . Tobacco comment: social in college  Substance and Sexual Activity  . Alcohol use: Yes    Alcohol/week: 0.0 oz    Comment: occ 2-3 x week  . Drug use: No  . Sexual activity: Not on file  Lifestyle  . Physical activity:    Days per week: Not on file    Minutes per session: Not on file  . Stress: Not on file  Relationships  . Social connections:    Talks on phone: Not on file    Gets together: Not on file    Attends religious service: Not on file    Active member of club or organization: Not on file    Attends meetings of clubs or organizations: Not on file    Relationship status: Not on file  . Intimate partner violence:    Fear of current or ex partner: Not on file    Emotionally abused: Not on file    Physically abused: Not on file    Forced sexual activity: Not on file  Other Topics Concern  . Not on file  Social History Narrative   Right handed, FT - Licensed clinical SW, (on [employer]), Masters SW, Caffeine 2-3 cups coffee, Divorced, 2 kids      PHYSICAL EXAM  Vitals:   02/26/18 0847  BP:  122/83  Pulse: 80  Weight: 166 lb 12.8 oz (75.7 kg)  Height: 5\' 3"  (1.6 m)   Body mass index is 29.55 kg/m.  Generalized: Well developed, in no acute distress   Neurological examination  Mentation: Alert oriented to time, place, history taking. Follows all commands speech and language fluent Cranial nerve II-XII: Pupils were equal round  reactive to light. Extraocular movements were full, visual field were full on confrontational test. Facial sensation and strength were normal. Uvula tongue midline. Head turning and shoulder shrug  were normal and symmetric. Motor: The motor testing reveals 5 over 5 strength of all 4 extremities. Good symmetric motor tone is noted throughout.  Sensory: Sensory testing is intact to soft touch on all 4 extremities. No evidence of extinction is noted.  Coordination: Cerebellar testing reveals good finger-nose-finger and heel-to-shin bilaterally.  Gait and station: Gait is normal. Tandem gait is normal. Romberg is negative. No drift is seen.  Reflexes: Deep tendon reflexes are symmetric and normal bilaterally.   DIAGNOSTIC DATA (LABS, IMAGING, TESTING) - I reviewed patient records, labs, notes, testing and imaging myself where available.  Lab Results  Component Value Date   WBC 6.7 06/07/2015   HGB 13.1 06/07/2015   HCT 38.6 06/07/2015   MCV 94.6 06/07/2015   PLT 280 06/07/2015      Component Value Date/Time   NA 138 06/07/2015 0845   NA 141 10/01/2012 0919   K 4.0 06/07/2015 0845   K 4.0 10/01/2012 0919   CL 105 06/07/2015 0845   CL 108 (H) 10/01/2012 0919   CO2 25 06/07/2015 0845   CO2 25 10/01/2012 0919   GLUCOSE 88 06/07/2015 0845   GLUCOSE 77 10/01/2012 0919   BUN 13 06/07/2015 0845   BUN 12 10/01/2012 0919   CREATININE 0.70 06/07/2015 0845   CREATININE 0.74 10/01/2012 0919   CALCIUM 8.9 06/07/2015 0845   CALCIUM 8.2 (L) 10/01/2012 0919   PROT 6.9 06/07/2015 0845   PROT 7.1 10/01/2012 0919   ALBUMIN 4.1 06/07/2015 0845   ALBUMIN 3.7  10/01/2012 0919   AST 21 06/07/2015 0845   AST 19 10/01/2012 0919   ALT 18 06/07/2015 0845   ALT 19 10/01/2012 0919   ALKPHOS 95 06/07/2015 0845   ALKPHOS 91 10/01/2012 0919   BILITOT 0.6 06/07/2015 0845   BILITOT 0.5 10/01/2012 0919   GFRNONAA >60 06/07/2015 0845   GFRNONAA >60 10/01/2012 0919   GFRAA >60 06/07/2015 0845   GFRAA >60 10/01/2012 0919   Lab Results  Component Value Date   CHOL 168 10/01/2012   HDL 57 10/01/2012   LDLCALC 93 10/01/2012   TRIG 92 10/01/2012   Lab Results  Component Value Date   HGBA1C 5.1 06/07/2015   No results found for: [id number] Lab Results  Component Value Date   TSH 3.960 06/07/2015      ASSESSMENT AND PLAN 47 y.o. year old female  has a past medical history of Allergic rhinitis, Anxiety, Asthma, GERD (gastroesophageal reflux disease), Hypersomnia with sleep apnea, Hypothyroidism, Irritable bowel syndrome, OSA (obstructive sleep apnea), Persistent hypersomnia, and Tubular adenoma. here with:  1.  Idiopathic hypersomnia.  2.  Numbness right hand  Overall the patient has done well on Strattera.  She will continue Strattera 75 mg daily.  She is having some numbness in the right hand that she wakes up with.  This seems to be consistent with possible carpal tunnel syndrome.  We discussed nerve conduction studies.  The patient will start also trying to use a brace on that hand.  If the numbness does not improve she will let us know.  She will follow-up in 1 year or sooner if needed.     Ward Givens, MSN, NP-C 02/26/2018, 8:53 AM [facility] [address] [place], [place] [address] [phone]

## 2018-03-20 ENCOUNTER — Telehealth: Payer: Self-pay | Admitting: *Deleted

## 2018-03-20 MED ORDER — ATOMOXETINE HCL 25 MG PO CAPS: ORAL_CAPSULE | ORAL | 1 refills | Status: DC

## 2018-03-20 NOTE — Telephone Encounter (Signed)
Received fax from [place], [place], re:  patient requesting 90 day supply. Rx changed to 90 day supply, 1 refill.

## 2018-04-18 DIAGNOSIS — M19041 Primary osteoarthritis, right hand: Secondary | ICD-10-CM | POA: Insufficient documentation

## 2018-04-18 DIAGNOSIS — R7689 Other specified abnormal immunological findings in serum: Secondary | ICD-10-CM | POA: Insufficient documentation

## 2018-06-03 ENCOUNTER — Telehealth: Payer: Self-pay | Admitting: Adult Health

## 2018-06-03 NOTE — Telephone Encounter (Signed)
[facility] needs clarification if pt is to be taking straterra and fluoxetine. Please call to advise

## 2018-06-03 NOTE — Telephone Encounter (Signed)
Heather Faulkner- should pt be on both?

## 2018-06-05 NOTE — Telephone Encounter (Signed)
Called and LVM for pt to call to give a little more information about if she is on both medications and if she has been taking long term. Advised there is potential for interaction.

## 2018-06-05 NOTE — Telephone Encounter (Signed)
There is a potential for interaction. I am not sure how long she has been on prozac. If she has been taking prozac with strattera for a while then it should be fine. However if its a new medication- Dr. Brett Fairy will need to decide if she can take both.

## 2018-06-09 NOTE — Telephone Encounter (Signed)
Megan- please advise  Patient called back. She has been on Strattera/fluoxetine for almost two years and tolerating well, no SE. Advised her to continue to monitor for potential interactions/SE. I will send message back to MM,NP to make sure she does not want to make any changes. If she does, we will call. If not, we will not call back. She verbalized understanding.

## 2018-06-09 NOTE — Telephone Encounter (Signed)
Called, LVM for pt to call office to discuss medication

## 2018-06-10 NOTE — Telephone Encounter (Signed)
Called and spoke with pharmacy. Relayed below information. Nothing further needed.

## 2018-06-10 NOTE — Telephone Encounter (Signed)
Ok to refill 

## 2018-06-10 NOTE — Telephone Encounter (Signed)
Tried calling pharmacy back. They do not open until 9am. Will try again later.

## 2018-09-25 ENCOUNTER — Other Ambulatory Visit: Payer: Self-pay | Admitting: Neurology

## 2019-01-26 ENCOUNTER — Encounter: Payer: Self-pay | Admitting: Adult Health

## 2019-03-03 ENCOUNTER — Ambulatory Visit: Payer: BLUE CROSS/BLUE SHIELD | Admitting: Adult Health

## 2019-06-16 ENCOUNTER — Other Ambulatory Visit: Payer: Self-pay

## 2019-06-16 ENCOUNTER — Encounter: Payer: Self-pay | Admitting: Emergency Medicine

## 2019-06-16 ENCOUNTER — Emergency Department
Admission: EM | Admit: 2019-06-16 | Discharge: 2019-06-16 | Disposition: A | Discharge status: Home / Self Care | Payer: BLUE CROSS/BLUE SHIELD | Attending: Emergency Medicine | Admitting: Emergency Medicine

## 2019-06-16 ENCOUNTER — Emergency Department: Payer: BLUE CROSS/BLUE SHIELD

## 2019-06-16 DIAGNOSIS — Z87891 Personal history of nicotine dependence: Secondary | ICD-10-CM | POA: Diagnosis not present

## 2019-06-16 DIAGNOSIS — E039 Hypothyroidism, unspecified: Secondary | ICD-10-CM | POA: Diagnosis not present

## 2019-06-16 DIAGNOSIS — J4520 Mild intermittent asthma, uncomplicated: Secondary | ICD-10-CM | POA: Diagnosis not present

## 2019-06-16 DIAGNOSIS — R079 Chest pain, unspecified: Secondary | ICD-10-CM | POA: Insufficient documentation

## 2019-06-16 LAB — CBC
HCT: 38.7 % (ref 36.0–46.0)
Hemoglobin: 12.9 g/dL (ref 12.0–15.0)
MCH: 32.3 pg (ref 26.0–34.0)
MCHC: 33.3 g/dL (ref 30.0–36.0)
MCV: 96.8 fL (ref 80.0–100.0)
Platelets: 323 10*3/uL (ref 150–400)
RBC: 4 MIL/uL (ref 3.87–5.11)
RDW: 12.7 % (ref 11.5–15.5)
WBC: 7 10*3/uL (ref 4.0–10.5)
nRBC: 0 % (ref 0.0–0.2)

## 2019-06-16 LAB — BASIC METABOLIC PANEL
Anion gap: 7 (ref 5–15)
BUN: 11 mg/dL (ref 6–20)
CO2: 26 mmol/L (ref 22–32)
Calcium: 9 mg/dL (ref 8.9–10.3)
Chloride: 105 mmol/L (ref 98–111)
Creatinine, Ser: 0.8 mg/dL (ref 0.44–1.00)
GFR calc Af Amer: >60 mL/min (ref >60)
GFR calc non Af Amer: >60 mL/min (ref >60)
Glucose, Bld: 112 mg/dL — ABNORMAL HIGH (ref 70–99)
Potassium: 3.4 mmol/L — ABNORMAL LOW (ref 3.5–5.1)
Sodium: 138 mmol/L (ref 135–145)

## 2019-06-16 LAB — TROPONIN I (HIGH SENSITIVITY): Troponin I (High Sensitivity): 3 ng/L (ref <18)

## 2019-06-16 MED ORDER — HYDROCOD POLST-CPM POLST ER 10-8 MG/5ML PO SUER: 5.0000 mL | Freq: Two times a day (BID) | ORAL | 0 refills | Status: DC

## 2019-06-16 NOTE — ED Provider Notes (Signed)
[facility] Emergency Department Provider Note       Time seen: ----------------------------------------- 2:11 PM on 06/16/2019 -----------------------------------------   I have reviewed the triage vital signs and the nursing notes.  HISTORY   Chief Complaint Chest Pain    HPI Heather Faulkner is a 48 y.o. female with a history of anxiety, asthma, GERD, hypothyroidism who presents to the ED for intermittent chest pain that radiates to her jaw and arms.  Patient states the pain feels like tightness in her chest with tingling in her arms.  Pain started on Saturday.  Currently 4 out of 10.  Past Medical History:  Diagnosis Date  . Allergic rhinitis   . Anxiety   . Asthma   . GERD (gastroesophageal reflux disease)   . Hypersomnia with sleep apnea   . Hypothyroidism   . Irritable bowel syndrome   . OSA (obstructive sleep apnea)   . Persistent hypersomnia   . Tubular adenoma     Patient Active Problem List   Diagnosis Date Noted  . Attention deficit 02/11/2017  . Excessive daytime sleepiness 01/04/2015  . Hypersomnia, persistent 12/29/2014  . 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 Sleep disorder not due to substance or known physiological condition 12/29/2014  . Mild intermittent asthma 11/17/2014  . Extrinsic asthma 09/20/2014  . Primary snoring 09/20/2014  . Uncomplicated asthma 42/87/6811  . Persistent hypersomnia   . IBS (irritable bowel syndrome) 05/12/2014    Past Surgical History:  Procedure Laterality Date  . BUNIONECTOMY Right   . cesarean     x 2  . CHOLECYSTECTOMY    . COLONOSCOPY     x 2  . nasal surgery (polyps)     x2    Allergies Patient has no known allergies.  Social History Social History   Tobacco Use  . Smoking status: Former Smoker    Types: Cigarettes  . Smokeless tobacco: Never Used  . Tobacco comment: social in college  Substance Use Topics  . Alcohol use: Yes    Alcohol/week: 0.0 standard drinks   Comment: occ 2-3 x week  . Drug use: No   Review of Systems Constitutional: Negative for fever. Cardiovascular: Positive for chest pain Respiratory: Negative for shortness of breath. Gastrointestinal: Negative for abdominal pain, vomiting and diarrhea. Musculoskeletal: Negative for back pain. Skin: Negative for rash. Neurological: Positive for paresthesias  All systems negative/normal/unremarkable except as stated in the HPI  ____________________________________________   PHYSICAL EXAM:  VITAL SIGNS: ED Triage Vitals  Enc Vitals Group     BP 06/16/19 1313 (!) 148/95     Pulse Rate 06/16/19 1313 85     Resp 06/16/19 1313 16     Temp 06/16/19 1313 99 F (37.2 C)     Temp Source 06/16/19 1313 Oral     SpO2 06/16/19 1313 100 %     Weight 06/16/19 1314 183 lb (83 kg)     Height 06/16/19 1314 5\' 3"  (1.6 m)     Head Circumference --      Peak Flow --      Pain Score 06/16/19 1313 4     Pain Loc --      Pain Edu? --      Excl. in [place]? --    Constitutional: Alert and oriented. Well appearing and in no distress. Eyes: Conjunctivae are normal. Normal extraocular movements. ENT      Head: Normocephalic and atraumatic.      Nose: No congestion/rhinnorhea.  Mouth/Throat: Mucous membranes are moist.      Neck: No stridor. Cardiovascular: Normal rate, regular rhythm. No murmurs, rubs, or gallops. Respiratory: Normal respiratory effort without tachypnea nor retractions. Breath sounds are clear and equal bilaterally. No wheezes/rales/rhonchi. Gastrointestinal: Soft and nontender. Normal bowel sounds Musculoskeletal: Nontender with normal range of motion in extremities. No lower extremity tenderness nor edema. Neurologic:  Normal speech and language. No gross focal neurologic deficits are appreciated.  Skin:  Skin is warm, dry and intact. No rash noted. Psychiatric: Mood and affect are normal. Speech and behavior are normal.  ____________________________________________  EKG:  Interpreted by me.  Sinus rhythm with rate of 79 bpm, normal PR interval, normal QRS, normal QT  ____________________________________________  ED COURSE:  As part of my medical decision making, I reviewed the following data within the [place] History obtained from family if available, nursing notes, old chart and ekg, as well as notes from prior ED visits. Patient presented for chest pain, we will assess with labs and imaging as indicated at this time.   Procedures  Heather Faulkner was evaluated in Emergency Department on 06/16/2019 for the symptoms described in the history of present illness. She was evaluated in the context of the global COVID-19 pandemic, which necessitated consideration that the patient might be at risk for infection with the SARS-CoV-2 virus that causes COVID-19. Institutional protocols and algorithms that pertain to the evaluation of patients at risk for COVID-19 are in a state of rapid change based on information released by regulatory bodies including the CDC and federal and state organizations. These policies and algorithms were followed during the patient's care in the ED.  ____________________________________________   LABS (pertinent positives/negatives)  Labs Reviewed  BASIC METABOLIC PANEL - Abnormal; Notable for the following components:      Result Value   Potassium 3.4 (*)    Glucose, Bld 112 (*)    All other components within normal limits  CBC  TROPONIN I (HIGH SENSITIVITY)    RADIOLOGY  Chest x-ray is unremarkable  ____________________________________________   DIFFERENTIAL DIAGNOSIS   Musculoskeletal pain, GERD, anxiety, unstable angina, PE  FINAL ASSESSMENT AND PLAN  Chest pain   Plan: The patient had presented for nonspecific chest pain. Patient's labs are reassuring. Patient's imaging is unremarkable.  She is cleared for outpatient follow-up.   Laurence Aly, MD    Note: This note was generated in part  or whole with voice recognition software. Voice recognition is usually quite accurate but there are transcription errors that can and very often do occur. I apologize for any typographical errors that were not detected and corrected.     Earleen Newport, MD 06/16/19 [phone]

## 2019-06-16 NOTE — ED Notes (Signed)
Sent green and purple tubes to lab. 

## 2019-06-16 NOTE — ED Notes (Signed)
EDP to bedside. 

## 2019-06-16 NOTE — ED Notes (Signed)
Signature pad not working at time of discharge.  Pt expresses verbal understanding of paper work without any further questions at this time.

## 2019-06-16 NOTE — ED Triage Notes (Signed)
Pt arrives with complaints of intermittent chest pain that radiates to her jaw and arms. Pt states the pain feels like a "tingly in my arms and a tightness in my chest." P t states the pain started Saturday.

## 2019-07-29 ENCOUNTER — Other Ambulatory Visit: Payer: Self-pay | Admitting: Obstetrics & Gynecology

## 2019-07-29 DIAGNOSIS — Z1231 Encounter for screening mammogram for malignant neoplasm of breast: Secondary | ICD-10-CM

## 2019-08-19 DIAGNOSIS — M25562 Pain in left knee: Secondary | ICD-10-CM | POA: Insufficient documentation

## 2019-08-19 DIAGNOSIS — M7062 Trochanteric bursitis, left hip: Secondary | ICD-10-CM | POA: Insufficient documentation

## 2019-09-02 ENCOUNTER — Ambulatory Visit
Admission: RE | Admit: 2019-09-02 | Discharge: 2019-09-02 | Disposition: A | Discharge status: Home / Self Care | Payer: BLUE CROSS/BLUE SHIELD | Source: Ambulatory Visit | Attending: Obstetrics & Gynecology | Admitting: Obstetrics & Gynecology

## 2019-09-02 DIAGNOSIS — Z1231 Encounter for screening mammogram for malignant neoplasm of breast: Secondary | ICD-10-CM | POA: Insufficient documentation

## 2019-09-03 ENCOUNTER — Other Ambulatory Visit: Payer: Self-pay | Admitting: Obstetrics & Gynecology

## 2019-09-03 DIAGNOSIS — N6320 Unspecified lump in the left breast, unspecified quadrant: Secondary | ICD-10-CM

## 2019-09-03 DIAGNOSIS — R928 Other abnormal and inconclusive findings on diagnostic imaging of breast: Secondary | ICD-10-CM

## 2019-09-16 ENCOUNTER — Ambulatory Visit
Admission: RE | Admit: 2019-09-16 | Discharge: 2019-09-16 | Disposition: A | Discharge status: Home / Self Care | Payer: BLUE CROSS/BLUE SHIELD | Source: Ambulatory Visit | Attending: Obstetrics & Gynecology | Admitting: Obstetrics & Gynecology

## 2019-09-16 DIAGNOSIS — N6320 Unspecified lump in the left breast, unspecified quadrant: Secondary | ICD-10-CM | POA: Diagnosis present

## 2019-09-16 DIAGNOSIS — R928 Other abnormal and inconclusive findings on diagnostic imaging of breast: Secondary | ICD-10-CM | POA: Insufficient documentation

## 2019-09-21 ENCOUNTER — Other Ambulatory Visit: Payer: Self-pay | Admitting: Obstetrics & Gynecology

## 2019-09-21 DIAGNOSIS — R928 Other abnormal and inconclusive findings on diagnostic imaging of breast: Secondary | ICD-10-CM

## 2019-09-21 DIAGNOSIS — N6320 Unspecified lump in the left breast, unspecified quadrant: Secondary | ICD-10-CM

## 2019-09-28 DIAGNOSIS — M7061 Trochanteric bursitis, right hip: Secondary | ICD-10-CM | POA: Insufficient documentation

## 2019-10-01 ENCOUNTER — Ambulatory Visit
Admission: RE | Admit: 2019-10-01 | Discharge: 2019-10-01 | Disposition: A | Discharge status: Home / Self Care | Payer: BLUE CROSS/BLUE SHIELD | Source: Ambulatory Visit | Attending: Obstetrics & Gynecology | Admitting: Obstetrics & Gynecology

## 2019-10-01 DIAGNOSIS — N6320 Unspecified lump in the left breast, unspecified quadrant: Secondary | ICD-10-CM | POA: Diagnosis present

## 2019-10-01 DIAGNOSIS — R928 Other abnormal and inconclusive findings on diagnostic imaging of breast: Secondary | ICD-10-CM | POA: Diagnosis not present

## 2019-10-01 HISTORY — PX: BREAST BIOPSY: SHX20

## 2019-10-02 LAB — SURGICAL PATHOLOGY

## 2019-10-08 DIAGNOSIS — E66811 Obesity, class 1: Secondary | ICD-10-CM | POA: Insufficient documentation

## 2019-10-08 DIAGNOSIS — R7989 Other specified abnormal findings of blood chemistry: Secondary | ICD-10-CM | POA: Insufficient documentation

## 2020-02-26 ENCOUNTER — Ambulatory Visit: Payer: 59 | Attending: Internal Medicine

## 2020-02-26 ENCOUNTER — Other Ambulatory Visit: Payer: Self-pay

## 2020-02-26 DIAGNOSIS — Z23 Encounter for immunization: Secondary | ICD-10-CM

## 2020-02-26 NOTE — Progress Notes (Signed)
   Covid-19 Vaccination Clinic  Name:  Heather Faulkner    MRN: [id number] DOB: 1971/08/25  02/26/2020  [name] was observed post Covid-19 immunization for 15 minutes without incident. She was provided with Vaccine Information Sheet and instruction to access the V-Safe system.   [name] was instructed to call 911 with any severe reactions post vaccine: [name] Difficulty breathing  . Swelling of face and throat  . A fast heartbeat  . A bad rash all over body  . Dizziness and weakness   Immunizations Administered    Name Date Dose VIS Date Route   Pfizer COVID-19 Vaccine 02/26/2020  8:23 AM 0.3 mL 11/06/2019 Intramuscular   Manufacturer: [place]   Lot: [id number]   [place]: [id number]

## 2020-03-22 ENCOUNTER — Ambulatory Visit: Payer: 59 | Attending: Internal Medicine

## 2020-03-22 DIAGNOSIS — Z23 Encounter for immunization: Secondary | ICD-10-CM

## 2020-03-22 NOTE — Progress Notes (Signed)
   Covid-19 Vaccination Clinic  Name:  Heather Faulkner    MRN: [id number] DOB: January 19, 1971  03/22/2020  [name] was observed post Covid-19 immunization for 15 minutes without incident. She was provided with Vaccine Information Sheet and instruction to access the V-Safe system.   [name] was instructed to call 911 with any severe reactions post vaccine: [name] Difficulty breathing  . Swelling of face and throat  . A fast heartbeat  . A bad rash all over body  . Dizziness and weakness   Immunizations Administered    Name Date Dose VIS Date Route   Pfizer COVID-19 Vaccine 03/22/2020 11:32 AM 0.3 mL 01/20/2019 Intramuscular   Manufacturer: [place]   Lot: [id number]   [place]: [id number]

## 2020-04-19 DIAGNOSIS — M542 Cervicalgia: Secondary | ICD-10-CM | POA: Insufficient documentation

## 2020-09-01 ENCOUNTER — Other Ambulatory Visit: Payer: Self-pay

## 2020-09-01 ENCOUNTER — Ambulatory Visit (INDEPENDENT_AMBULATORY_CARE_PROVIDER_SITE_OTHER): Payer: 59 | Admitting: Dermatology

## 2020-09-01 DIAGNOSIS — K130 Diseases of lips: Secondary | ICD-10-CM

## 2020-09-01 DIAGNOSIS — L710 Perioral dermatitis: Secondary | ICD-10-CM | POA: Diagnosis not present

## 2020-09-01 MED ORDER — DOXYCYCLINE HYCLATE 20 MG PO TABS: 20.0000 mg | ORAL_TABLET | Freq: Two times a day (BID) | ORAL | 2 refills | Status: AC

## 2020-09-01 MED ORDER — HYDROCORTISONE 2.5 % EX OINT: TOPICAL_OINTMENT | Freq: Two times a day (BID) | CUTANEOUS | 1 refills | Status: DC

## 2020-09-01 NOTE — Progress Notes (Signed)
   New Patient Visit  Subjective  Heather Faulkner is a 49 y.o. female who presents for the following: Rash (Rash around mouth, present for at least 1 month. ).  Rash comes and goes. It does itch and burn sometimes, gets real red. She has been using EOS chapstick and Vaseline. The only new thing patient has tried is something from [employer].  No personal h/o skin cancer.   The following portions of the chart were reviewed this encounter and updated as appropriate:  Tobacco  Allergies  Meds  Problems  Med Hx  Surg Hx  Fam Hx      Review of Systems:  No other skin or systemic complaints except as noted in HPI or Assessment and Plan.  Objective  Well appearing patient in no apparent distress; mood and affect are within normal limits.  A focused examination was performed including face, eyelids, lips. Relevant physical exam findings are noted in the Assessment and Plan.  Objective  perioral: Many inflammatory papules perioral and paranasal distribution  Objective  lips: Xerotic erythematous mucosal lip   Assessment & Plan  Perioral dermatitis perioral  Start doxycycline 20mg  twice daily with food x 2 months then may decrease to once daily #60 2RF  Doxycycline should be taken with food to prevent nausea. Do not lay down for 30 minutes after taking. Be cautious with sun exposure and use good sun protection while on this medication. Pregnant women should not take this medication.    Ordered Medications: doxycycline (PERIOSTAT) 20 MG tablet  Cheilitis lips  Possible allergic contact dermatitis vs due to xerosis > actinic cheilitis.  Avoid Beeswax (propolis) containing products.   Start HC 2.5% ointment twice daily x 1 week   Recheck at follow-up.   Consider patch testing if not clearing up.   hydrocortisone 2.5 % ointment - lips  Return for 6-8 weeks perioral dermatitis.  Graciella Belton, RMA, am acting as scribe for Forest Gleason, MD .  Documentation: I  have reviewed the above documentation for accuracy and completeness, and I agree with the above.  Forest Gleason, MD

## 2020-09-01 NOTE — Patient Instructions (Addendum)
Perioral dermatitis  Recommend Aquaphor lip balm  Start doxycycline 20mg  twice daily with food x 2 months then may decrease to once daily.  Doxycycline should be taken with food to prevent nausea. Do not lay down for 30 minutes after taking. Be cautious with sun exposure and use good sun protection while on this medication. Pregnant women should not take this medication.

## 2020-09-20 ENCOUNTER — Encounter: Payer: Self-pay | Admitting: Dermatology

## 2020-10-04 ENCOUNTER — Other Ambulatory Visit: Payer: Self-pay | Admitting: Obstetrics & Gynecology

## 2020-10-04 DIAGNOSIS — N6320 Unspecified lump in the left breast, unspecified quadrant: Secondary | ICD-10-CM

## 2020-10-25 ENCOUNTER — Other Ambulatory Visit: Payer: Self-pay

## 2020-10-25 ENCOUNTER — Ambulatory Visit (INDEPENDENT_AMBULATORY_CARE_PROVIDER_SITE_OTHER): Payer: 59 | Admitting: Dermatology

## 2020-10-25 ENCOUNTER — Encounter: Payer: Self-pay | Admitting: Dermatology

## 2020-10-25 DIAGNOSIS — K130 Diseases of lips: Secondary | ICD-10-CM

## 2020-10-25 DIAGNOSIS — L578 Other skin changes due to chronic exposure to nonionizing radiation: Secondary | ICD-10-CM | POA: Diagnosis not present

## 2020-10-25 DIAGNOSIS — L710 Perioral dermatitis: Secondary | ICD-10-CM | POA: Diagnosis not present

## 2020-10-25 DIAGNOSIS — L988 Other specified disorders of the skin and subcutaneous tissue: Secondary | ICD-10-CM

## 2020-10-25 NOTE — Patient Instructions (Signed)
Recommend The Perfect A nightly  Recommend Alastin eye cream 1/2 pump to both eyes twice a day  Topical retinoid medications like tretinoin/Retin-A, adapalene/Differin, tazarotene/Fabior, and Epiduo/Epiduo Forte can cause dryness and irritation when first started. Only apply a pea-sized amount to the entire affected area. Avoid applying it around the eyes, edges of mouth and creases at the nose. If you experience irritation, use a good moisturizer first and/or apply the medicine less often. If you are doing well with the medicine, you can increase how often you use it until you are applying every night. Be careful with sun protection while using this medication as it can make you sensitive to the sun. This medicine should not be used by pregnant women.   Doxycycline should be taken with food to prevent nausea. Do not lay down for 30 minutes after taking. Be cautious with sun exposure and use good sun protection while on this medication. Pregnant women should not take this medication.   Recommend daily broad spectrum sunscreen SPF 30+ to sun-exposed areas, reapply every 2 hours as needed. Call for new or changing lesions.

## 2020-10-25 NOTE — Progress Notes (Signed)
   Follow-Up Visit   Subjective  Heather Faulkner is a 49 y.o. female who presents for the following: Dermatitis (6 weeks f/u dermatitis on the face treating with Doxycyline 20 mg bid, Hydrocortisone cream with a good response ).   The following portions of the chart were reviewed this encounter and updated as appropriate:  Tobacco  Allergies  Meds  Problems  Med Hx  Surg Hx  Fam Hx      Review of Systems:  No other skin or systemic complaints except as noted in HPI or Assessment and Plan.  Objective  Well appearing patient in no apparent distress; mood and affect are within normal limits.  A focused examination was performed including face, lips, eyelids, neck. Relevant physical exam findings are noted in the Assessment and Plan.  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 May need to increase dose if not clearing  Will send refill   Consider adding sulfa cleanseor other sulfur sodium sulfacetamide wash in future if not clearing well  Doxycycline should be taken with food to prevent nausea. Do not lay down for 30 minutes after taking. Be cautious with sun exposure and use good sun protection while on this medication. Pregnant women should not take this medication.     Rhytides Left Forehead  Recommend The Perfect A  (tretinoin 0.1% cream) nightly  Recommend Alastin eye cream (1/2 pump to both eyes twice a day)  Topical retinoid medications like tretinoin/Retin-A, adapalene/Differin, tazarotene/Fabior, and Epiduo/Epiduo Forte can cause dryness and irritation when first started. Only apply a pea-sized amount to the entire affected area. Avoid applying it around the eyes, edges of mouth and creases at the nose. If you experience irritation, use a good moisturizer first and/or apply the medicine less often. If you are doing well with the medicine, you can increase how often you use it until you are applying every night. Be careful with sun protection while using this medication as it can make you sensitive to the sun. This medicine should not be used by pregnant women.    Actinic Damage - chronic, secondary to cumulative UV radiation exposure/sun exposure over time - diffuse scaly erythematous macules with underlying dyspigmentation - Recommend daily broad spectrum sunscreen SPF 30+ to sun-exposed areas, reapply every 2 hours as needed.  - Call for new or changing lesions.  Return in about 2 months (around 12/25/2020).  I, Marye Round, CMA, am acting as scribe for Forest Gleason, MD .  Documentation: I have reviewed the above documentation for accuracy and completeness, and I agree with the above.  Forest Gleason, MD

## 2020-12-29 ENCOUNTER — Ambulatory Visit: Payer: 59 | Admitting: Dermatology

## 2021-01-30 IMAGING — US US BREAST*L* LIMITED INC AXILLA
1 series · 8 of 8 positions shown · non-contrast
Comparison: Previous exam(s).

CLINICAL DATA: Screening recall for a possible left breast mass.

EXAM:
DIGITAL DIAGNOSTIC LEFT MAMMOGRAM WITH CAD AND TOMO
ULTRASOUND LEFT BREAST

[Series 1: us breast*left* limited inc axilla · 0.06mm/px · 8 of 8 slices shown]
[im 1/8]
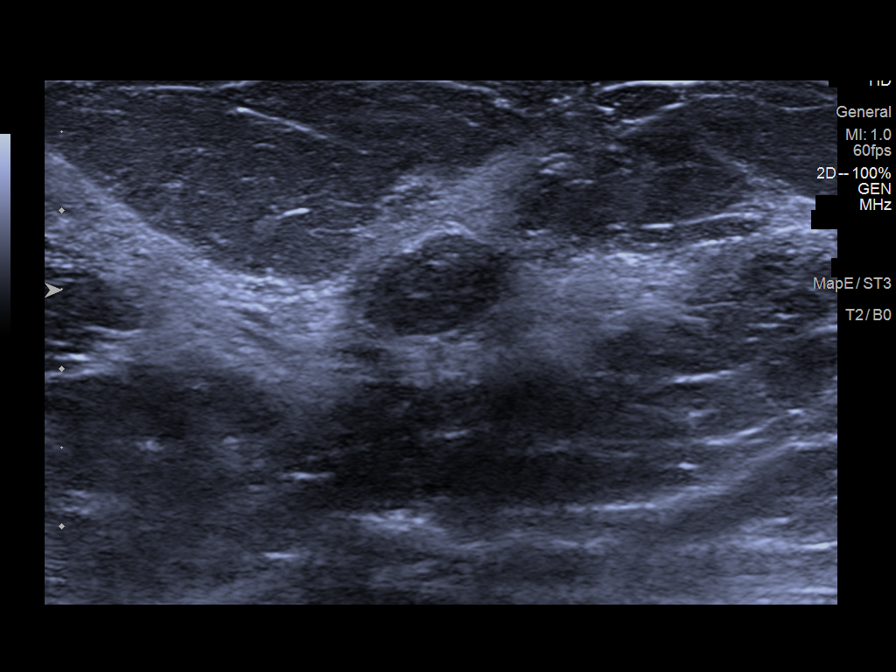
[im 2/8]
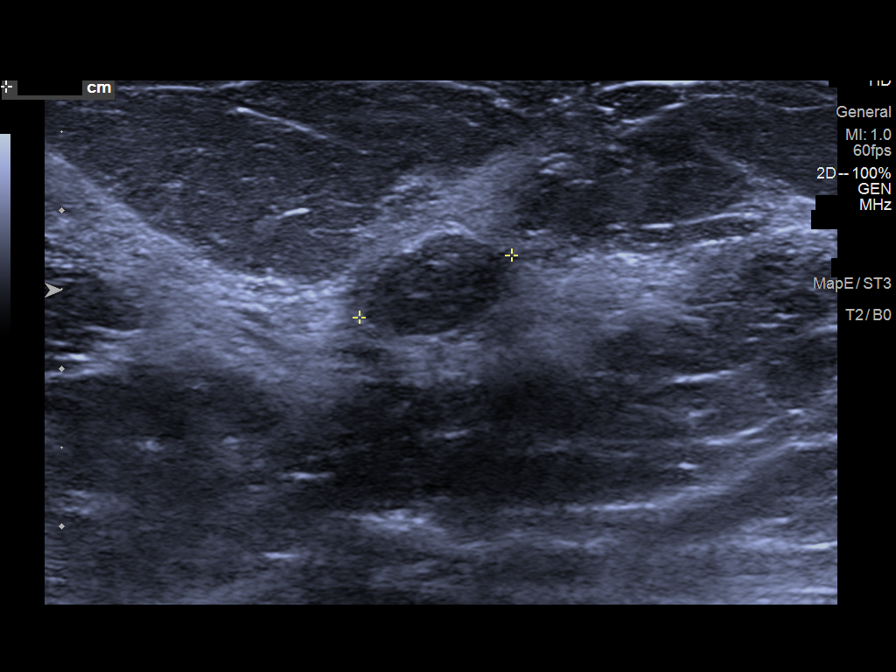
[im 3/8]
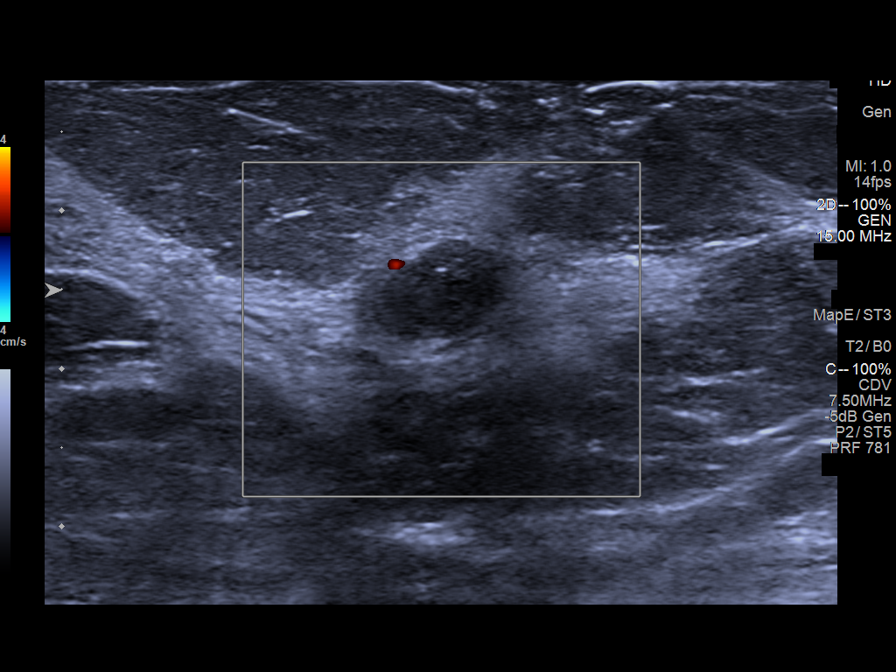
[im 4/8]
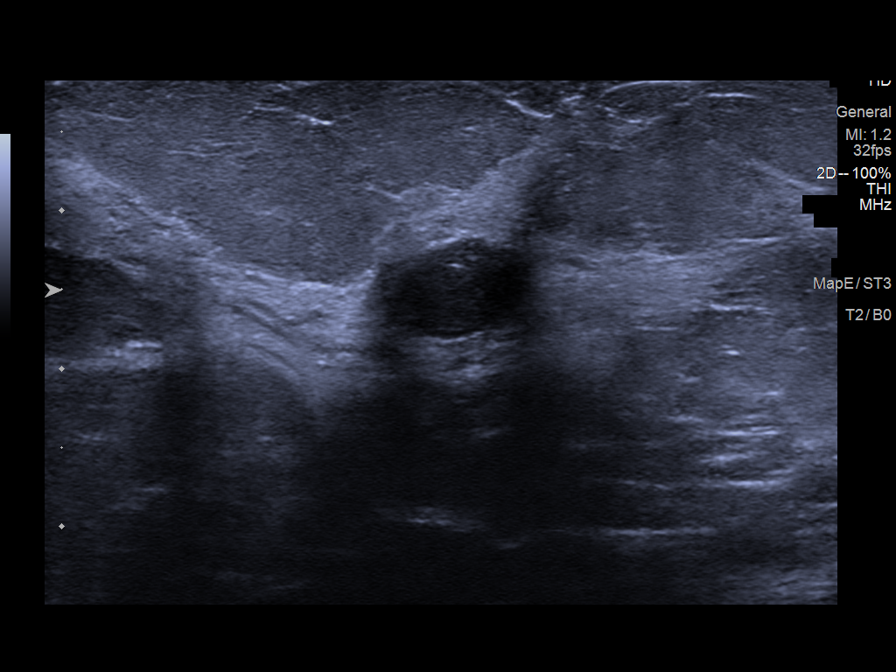
[im 5/8]
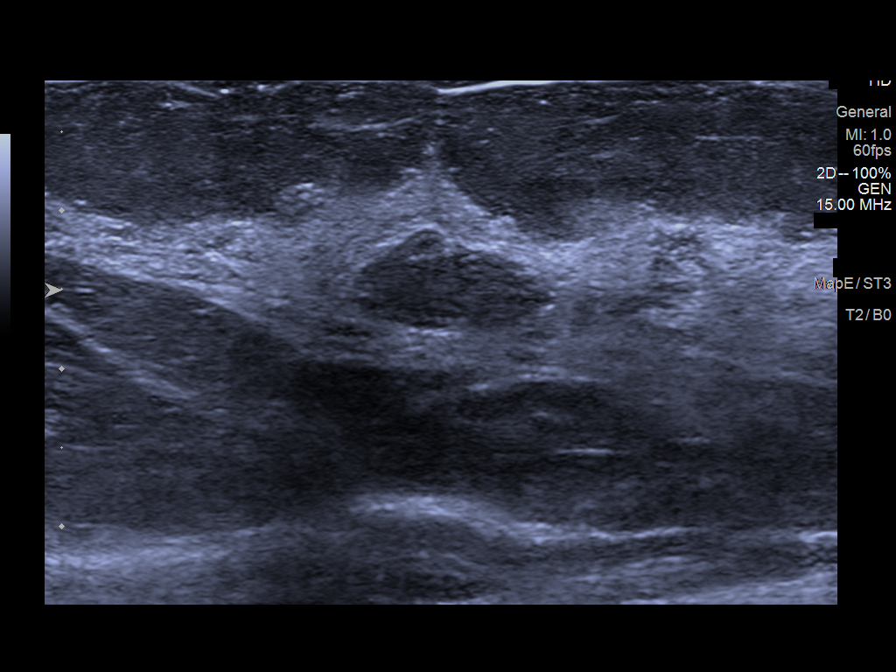
[im 6/8]
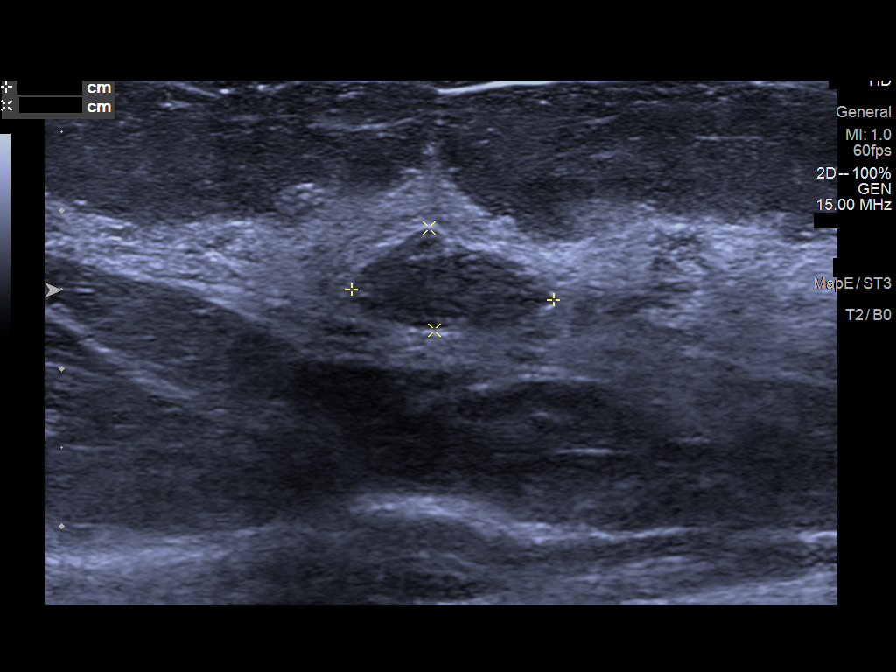
[im 7/8]
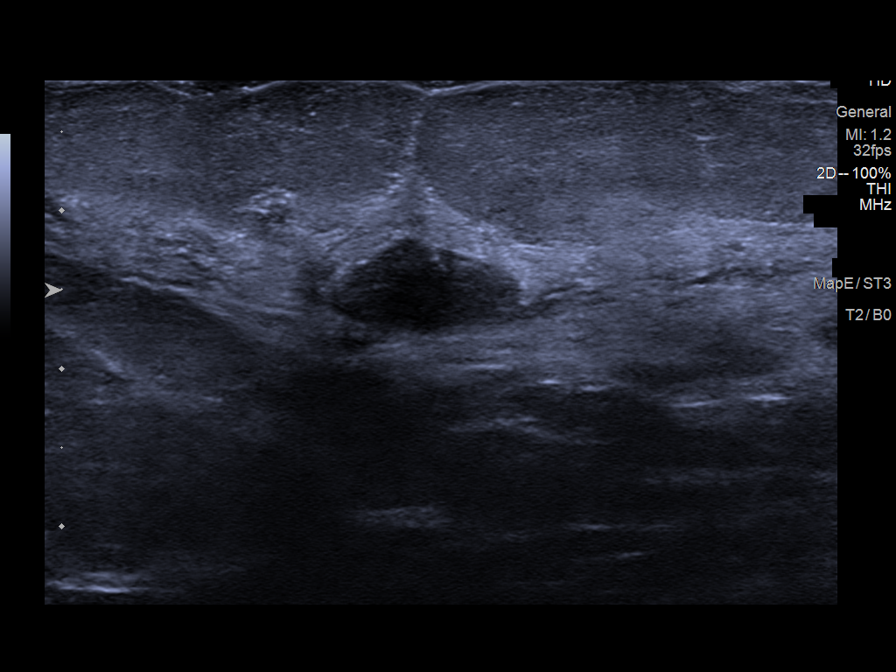
[im 8/8]
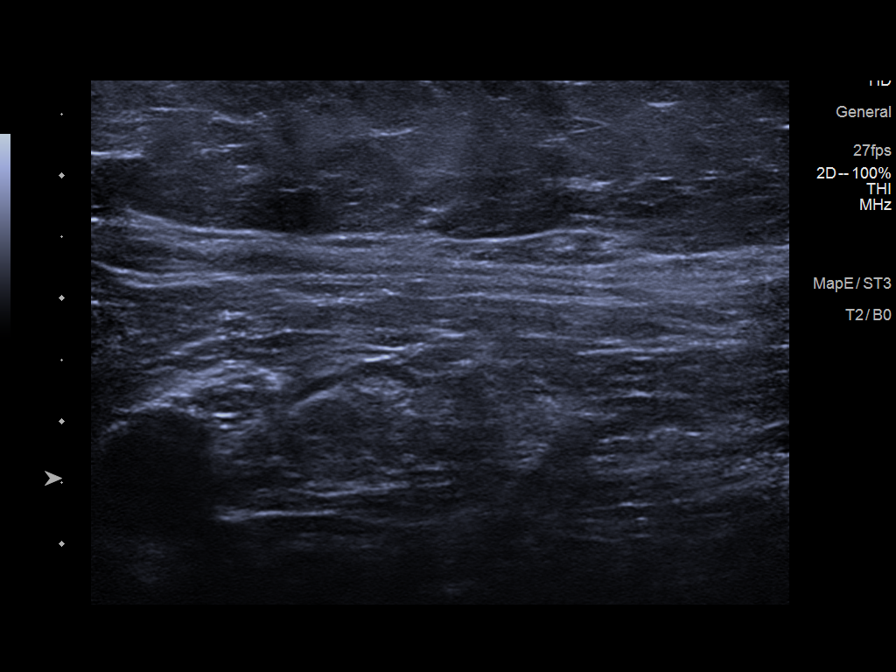

[8 of 8 positions shown; findings below may reference images not displayed]

ACR Breast Density Category c: The breast tissue is heterogeneously
dense, which may obscure small masses.
FINDINGS: In the lower outer anterior left breast there is a persistent
obscured mass on the spot compression tomosynthesis images measuring
approximately 1 cm.

Mammographic images were processed with CAD.

Ultrasound of the left breast at 5 o'clock, 1 cm from the nipple
demonstrates an oval hypoechoic mass with somewhat ill-defined
margins measuring 1.3 x 0.7 x 1.0 cm. Ultrasound of the left axilla
demonstrates normal-appearing lymph nodes.
IMPRESSION: 1. There is an indeterminate mass in the left breast at 5 o'clock.
While this is likely to represent a fibroadenoma, further evaluation
with biopsy is recommended due to the indistinct margins. It is
difficult to tell whether the mass is truly new as it is obscured in
dense breast tissue.

2.  No evidence of left axillary lymphadenopathy.

RECOMMENDATION:
Ultrasound guided biopsy is recommended for the left breast mass at
5 o'clock.

I have discussed the findings and recommendations with the patient.
If applicable, a reminder letter will be sent to the patient
regarding the next appointment.

BI-RADS CATEGORY  4: Suspicious.

## 2021-01-30 IMAGING — MG MM DIGITAL DIAGNOSTIC UNILAT*L* W/ TOMO
4 series · 4 of 12 positions shown · non-contrast
Comparison: Previous exam(s).

CLINICAL DATA: Screening recall for a possible left breast mass.

EXAM:
DIGITAL DIAGNOSTIC LEFT MAMMOGRAM WITH CAD AND TOMO
ULTRASOUND LEFT BREAST

[L CC synth-2D]
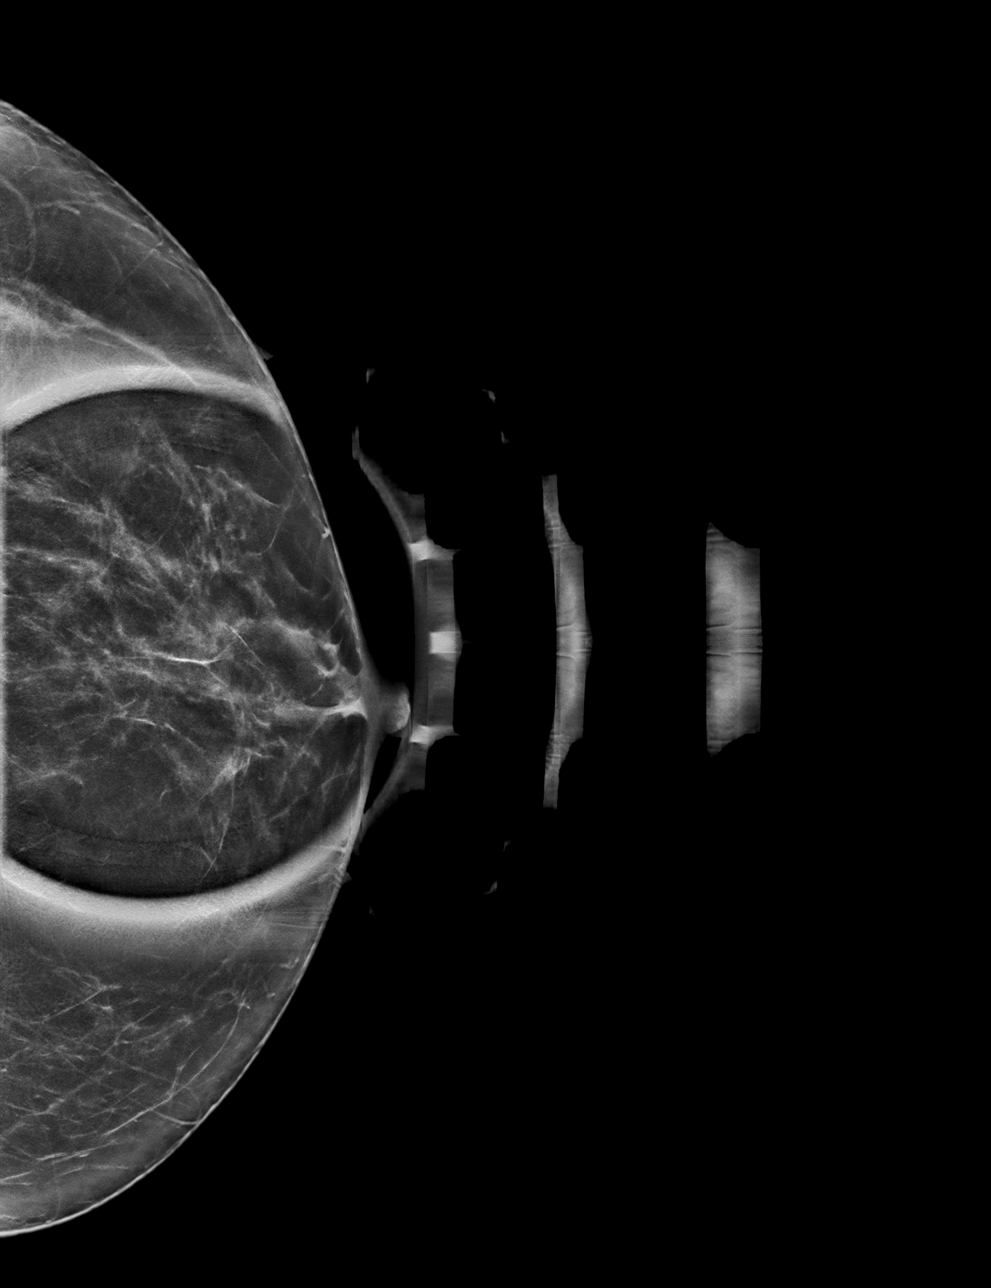

[L MLO synth-2D]
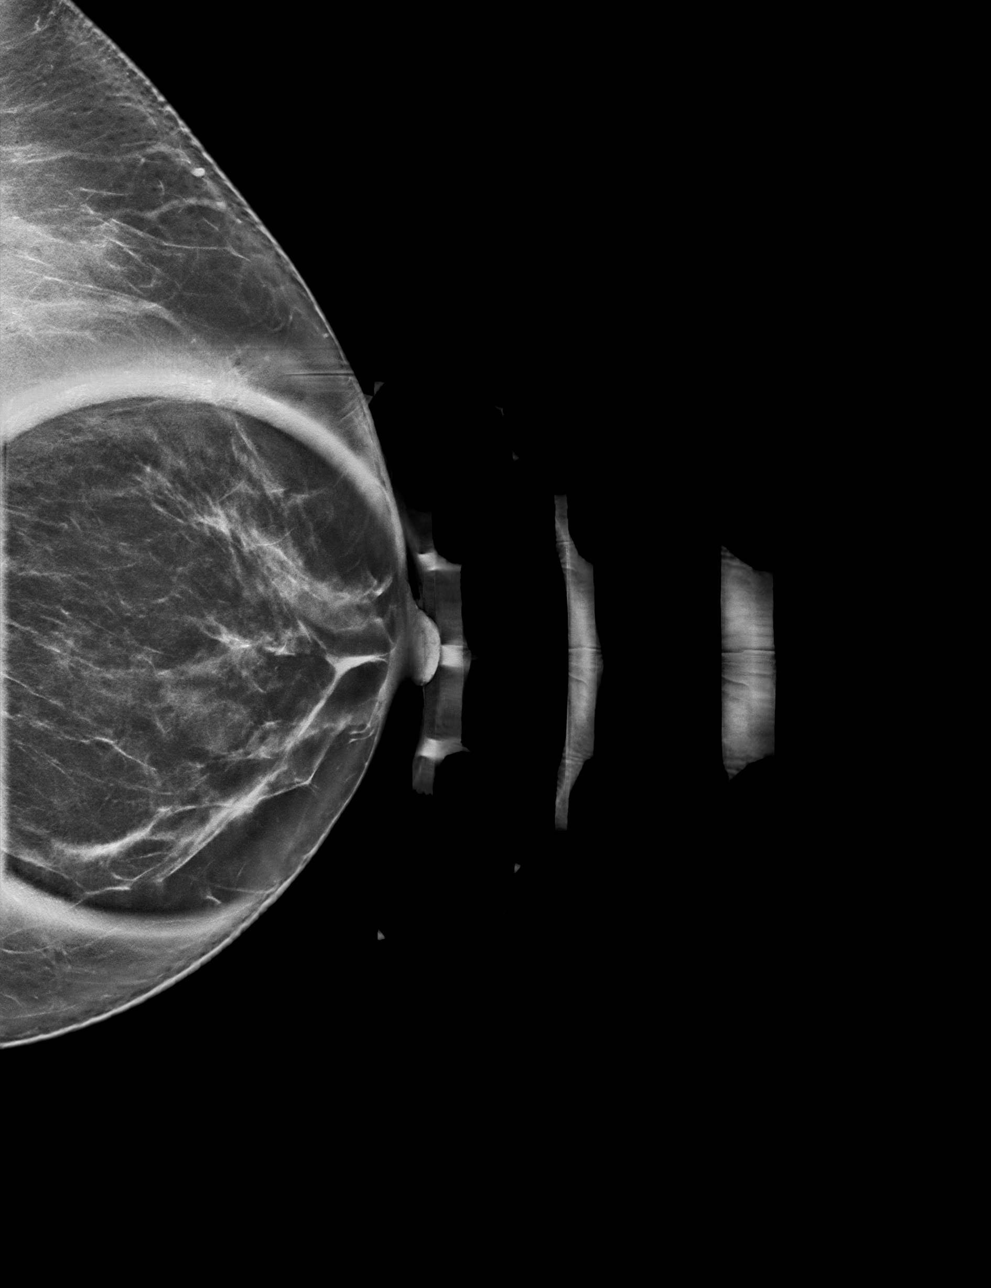

[L CC tomo · tomo slice 33/64.0]
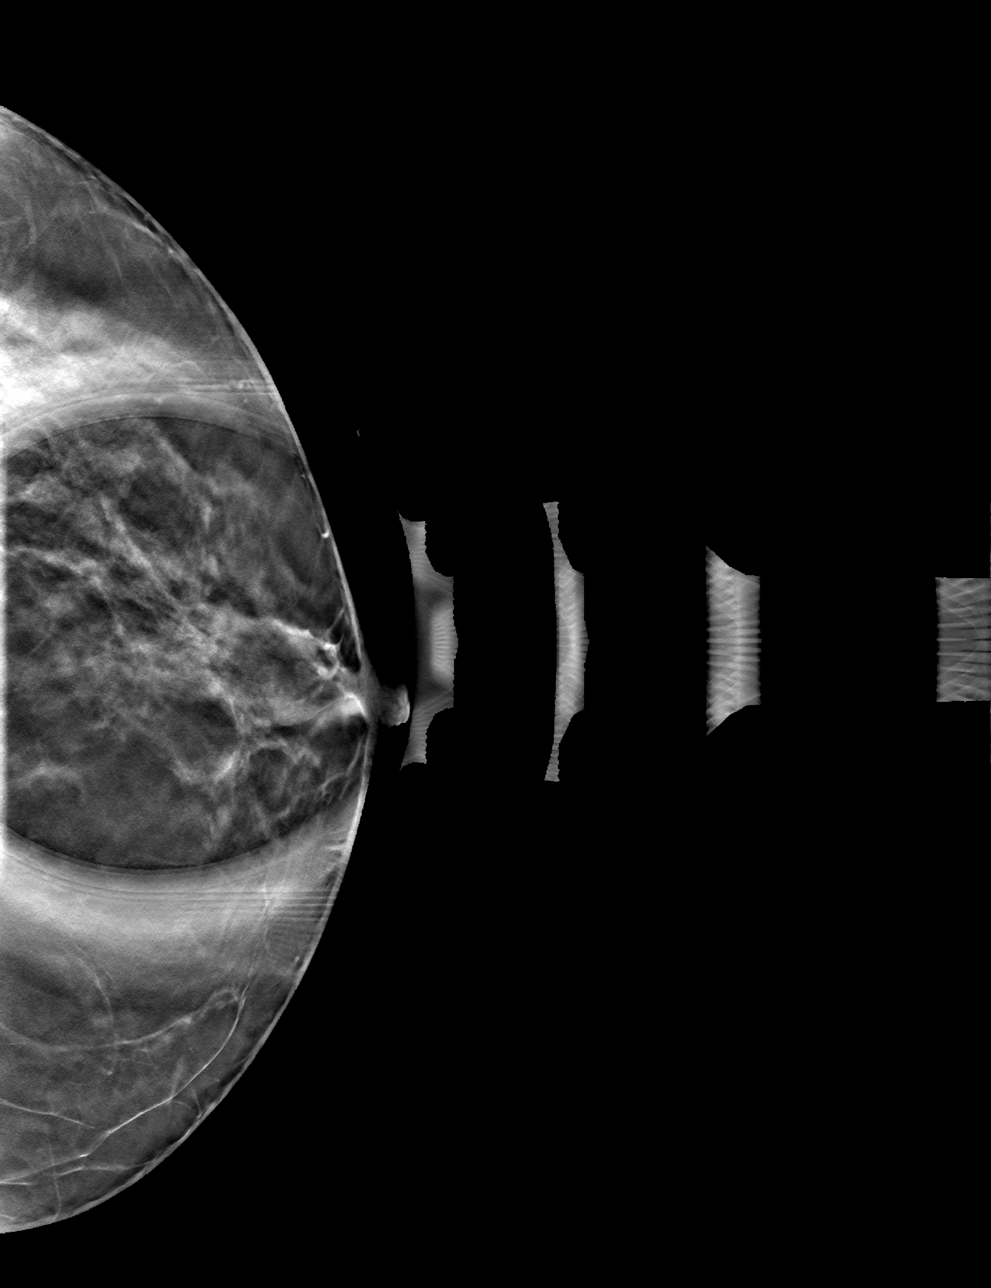

[L MLO tomo · tomo slice 37/74.0]
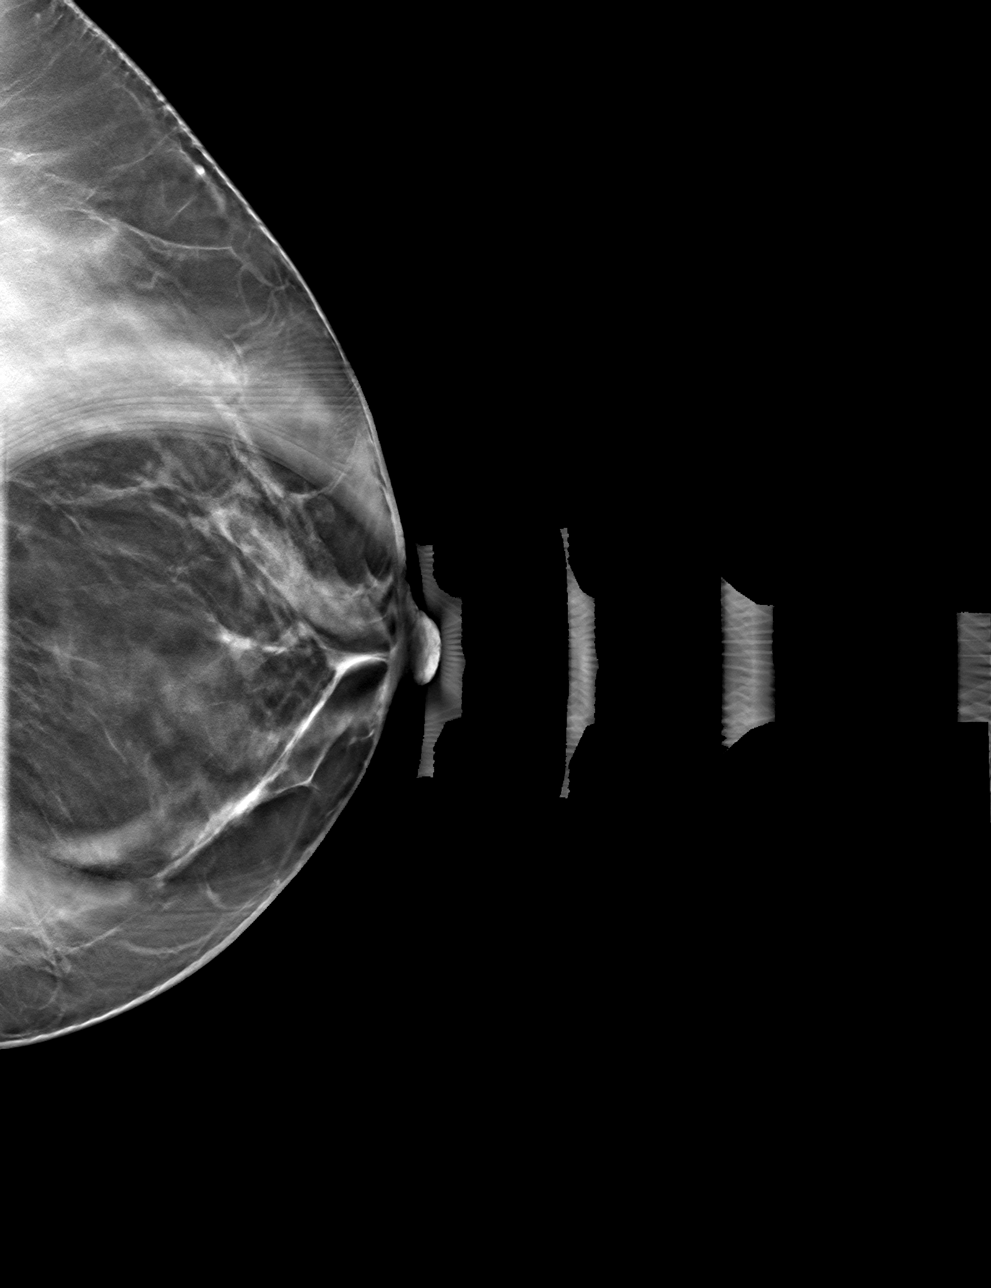

[4 of 12 positions shown; findings below may reference images not displayed]

ACR Breast Density Category c: The breast tissue is heterogeneously
dense, which may obscure small masses.
FINDINGS: In the lower outer anterior left breast there is a persistent
obscured mass on the spot compression tomosynthesis images measuring
approximately 1 cm.

Mammographic images were processed with CAD.

Ultrasound of the left breast at 5 o'clock, 1 cm from the nipple
demonstrates an oval hypoechoic mass with somewhat ill-defined
margins measuring 1.3 x 0.7 x 1.0 cm. Ultrasound of the left axilla
demonstrates normal-appearing lymph nodes.
IMPRESSION: 1. There is an indeterminate mass in the left breast at 5 o'clock.
While this is likely to represent a fibroadenoma, further evaluation
with biopsy is recommended due to the indistinct margins. It is
difficult to tell whether the mass is truly new as it is obscured in
dense breast tissue.

2.  No evidence of left axillary lymphadenopathy.

RECOMMENDATION:
Ultrasound guided biopsy is recommended for the left breast mass at
5 o'clock.

I have discussed the findings and recommendations with the patient.
If applicable, a reminder letter will be sent to the patient
regarding the next appointment.

BI-RADS CATEGORY  4: Suspicious.

## 2021-06-03 ENCOUNTER — Other Ambulatory Visit: Payer: Self-pay

## 2021-06-03 ENCOUNTER — Emergency Department
Admission: EM | Admit: 2021-06-03 | Discharge: 2021-06-03 | Disposition: A | Discharge status: Home / Self Care | Payer: 59 | Attending: Emergency Medicine | Admitting: Emergency Medicine

## 2021-06-03 ENCOUNTER — Emergency Department: Payer: 59

## 2021-06-03 DIAGNOSIS — E039 Hypothyroidism, unspecified: Secondary | ICD-10-CM | POA: Insufficient documentation

## 2021-06-03 DIAGNOSIS — J4520 Mild intermittent asthma, uncomplicated: Secondary | ICD-10-CM | POA: Diagnosis not present

## 2021-06-03 DIAGNOSIS — R55 Syncope and collapse: Secondary | ICD-10-CM | POA: Diagnosis not present

## 2021-06-03 DIAGNOSIS — F10120 Alcohol abuse with intoxication, uncomplicated: Secondary | ICD-10-CM | POA: Diagnosis not present

## 2021-06-03 DIAGNOSIS — Z87891 Personal history of nicotine dependence: Secondary | ICD-10-CM | POA: Diagnosis not present

## 2021-06-03 DIAGNOSIS — Z7951 Long term (current) use of inhaled steroids: Secondary | ICD-10-CM | POA: Insufficient documentation

## 2021-06-03 DIAGNOSIS — F10920 Alcohol use, unspecified with intoxication, uncomplicated: Secondary | ICD-10-CM

## 2021-06-03 LAB — COMPREHENSIVE METABOLIC PANEL
ALT: 14 U/L (ref 0–44)
AST: 21 U/L (ref 15–41)
Albumin: 4.3 g/dL (ref 3.5–5.0)
Alkaline Phosphatase: 76 U/L (ref 38–126)
Anion gap: 7 (ref 5–15)
BUN: 10 mg/dL (ref 6–20)
CO2: 23 mmol/L (ref 22–32)
Calcium: 8.1 mg/dL — ABNORMAL LOW (ref 8.9–10.3)
Chloride: 105 mmol/L (ref 98–111)
Creatinine, Ser: 0.78 mg/dL (ref 0.44–1.00)
GFR, Estimated: >60 mL/min (ref >60)
Glucose, Bld: 135 mg/dL — ABNORMAL HIGH (ref 70–99)
Potassium: 3.5 mmol/L (ref 3.5–5.1)
Sodium: 135 mmol/L (ref 135–145)
Total Bilirubin: 0.4 mg/dL (ref 0.3–1.2)
Total Protein: 7.2 g/dL (ref 6.5–8.1)

## 2021-06-03 LAB — CBC WITH DIFFERENTIAL/PLATELET
Abs Immature Granulocytes: 0.02 10*3/uL (ref 0.00–0.07)
Basophils Absolute: 0.1 10*3/uL (ref 0.0–0.1)
Basophils Relative: 1 %
Eosinophils Absolute: 0.1 10*3/uL (ref 0.0–0.5)
Eosinophils Relative: 2 %
HCT: 37.3 % (ref 36.0–46.0)
Hemoglobin: 12.4 g/dL (ref 12.0–15.0)
Immature Granulocytes: 0 %
Lymphocytes Relative: 33 %
Lymphs Abs: 2.3 10*3/uL (ref 0.7–4.0)
MCH: 31.6 pg (ref 26.0–34.0)
MCHC: 33.2 g/dL (ref 30.0–36.0)
MCV: 95.2 fL (ref 80.0–100.0)
Monocytes Absolute: 0.4 10*3/uL (ref 0.1–1.0)
Monocytes Relative: 6 %
Neutro Abs: 4.1 10*3/uL (ref 1.7–7.7)
Neutrophils Relative %: 58 %
Platelets: 342 10*3/uL (ref 150–400)
RBC: 3.92 MIL/uL (ref 3.87–5.11)
RDW: 12.7 % (ref 11.5–15.5)
WBC: 7.1 10*3/uL (ref 4.0–10.5)
nRBC: 0 % (ref 0.0–0.2)

## 2021-06-03 LAB — CK: Total CK: 64 U/L (ref 38–234)

## 2021-06-03 MED ORDER — LACTATED RINGERS IV BOLUS
1000.0000 mL | Freq: Once | INTRAVENOUS | Status: AC
  Administered 2021-06-03: 1000 mL via INTRAVENOUS

## 2021-06-03 NOTE — ED Notes (Signed)
Report called to Kelly, RN

## 2021-06-03 NOTE — ED Notes (Signed)
Pt back in bed and placed on monitor at this time.

## 2021-06-03 NOTE — ED Notes (Signed)
ED Provider at bedside. 

## 2021-06-03 NOTE — ED Triage Notes (Signed)
Pt presents to the ER via EMS due to alcohol intoxication. Per EMS, friend call, found lying in the grass. Vitals stable enroute.

## 2021-06-03 NOTE — ED Provider Notes (Addendum)
[facility] Emergency Department Provider Note  ____________________________________________   Event Date/Time   First MD Initiated Contact with Patient 06/03/21 0405     (approximate)  I have reviewed the triage vital signs and the nursing notes.   HISTORY  Chief Complaint Alcohol Intoxication    HPI Heather Faulkner is a 50 y.o. female with hypothyroidism, anxiety, asthma who comes in for alcohol intoxication.  Patient reports drinking too much alcohol today.  She denies any drug use.  She denies any SI.  She states that she just had too much to drink.  Patient was found down laying outside in the yard by a friend.  She denies any discomfort at this time she states that she just feels drunk.  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 Authorizing Provider   ADVAIR HFA 115-21 MCG/ACT inhaler INHALE 2 INHALATIONS INTO THE LUNGS EVERY 12 (TWELVE) HOURS. 11/22/17   [provider]  albuterol (VENTOLIN HFA) 108 (90 BASE) MCG/ACT inhaler Inhale into the lungs every 6 (six) hours as needed.     [provider]  atomoxetine (STRATTERA) 25 MG capsule TAKE 3 CAPSULES BY MOUTH IN THE MORNING 09/25/18   Dohmeier, Asencion Partridge, MD  budesonide (RHINOCORT AQUA) 32 MCG/ACT nasal spray USE 2 SPRAYS IN EACH NOSTRIL DAILY 07/23/14   [provider]  chlorpheniramine-HYDROcodone (TUSSIONEX PENNKINETIC ER) 10-8 MG/5ML SUER Take 5 mLs by mouth 2 (two) times daily. 06/16/19   Earleen Newport, MD  clonazePAM (KLONOPIN) 0.5 MG tablet Take 0.5 mg by mouth. 1/2 tab bid and then 1 tablet po qhs prn    [provider]  etonogestrel (NEXPLANON) 68 MG IMPL implant 1 each by Subdermal route once.    [provider]  FLUoxetine (PROZAC) 10 MG capsule Take by mouth daily. 01/31/18   [provider]  hydrocortisone 2.5 % ointment Apply topically 2 (two) times daily. For 1 week as needed 09/01/20   Moye, [place], MD  lamoTRIgine (LAMICTAL) 100 MG tablet Take 100 mg by mouth daily. 01/31/18   [provider]  montelukast (SINGULAIR) 10 MG tablet TAKE ONE TABLET BY MOUTH DAILY 08/17/14   [provider]  Probiotic  Product (PROBIOTIC ADVANCED PO) Take by mouth daily.    [provider]  SYNTHROID 112 MCG tablet TAKE 1 TAB BY MOUTH DAILY. TAKE ON AN EMPTY STOMACH W/ GLASS OF WATER AT LEAST 30-60MIN B4 BREAKFAST 06/10/17   [provider]  Vitamin D, Ergocalciferol, (DRISDOL) 50000 units CAPS capsule TAKE 1 CAPSULE (50,000 UNITS TOTAL) BY MOUTH ONCE A WEEK. 04/02/17   [provider]    Allergies Ketoconazole  Family History  Problem Relation Age of Onset   Hypertension Mother    Hyperlipidemia Mother    Polymyalgia rheumatica Mother    Hypertension Father    Breast cancer Other    Diabetes  Maternal Grandfather    Heart attack Maternal Grandfather    Stroke Paternal Grandmother    Celiac disease Daughter    Breast cancer Cousin     Social History Social History   Tobacco Use   Smoking status: Former    Pack years: 0.00    Types: Cigarettes   Smokeless tobacco: Never   Tobacco comments:    social in college  Substance Use Topics   Alcohol use: Yes    Alcohol/week: 0.0 standard drinks    Comment: occ 2-3 x week   Drug use: No      Review of Systems patient denied any concerns but somewhat limited due to patient being intoxicated Constitutional: No fever/chills Eyes: No visual changes. ENT: No sore throat. Cardiovascular: Denies chest pain. Respiratory: Denies shortness of breath. Gastrointestinal: No abdominal pain.  No nausea, no vomiting.  No diarrhea.  No constipation. Genitourinary: Negative for dysuria. Musculoskeletal: Negative for back pain. Skin: Negative for rash. Neurological: Negative for headaches, focal weakness or numbness. All other ROS negative ____________________________________________   PHYSICAL EXAM:  VITAL SIGNS: Blood pressure 112/80, pulse 85, temperature (!) 97.4 F (36.3 C), temperature source Oral, resp. rate 18, height 5\' 3"  (1.6 m), weight 77.1 kg, SpO2 97 %.   Constitutional: Alert and oriented. Slurred speech  Eyes: Conjunctivae are normal. EOMI. Head: Atraumatic. Nose: No congestion/rhinnorhea. Mouth/Throat: Mucous membranes are moist.   Neck: No stridor. Trachea Midline. FROM Cardiovascular: Normal rate, regular rhythm. Grossly normal heart sounds.  Good peripheral circulation. Respiratory: Normal respiratory effort.  No retractions. Lungs CTAB. Gastrointestinal: Soft and nontender. No distention. No abdominal bruits.  Musculoskeletal: No lower extremity tenderness nor edema.  No joint effusions. Neurologic: Patient has been able to move all extremities.  She has slurred speech Skin:  Skin is warm, dry and intact.  No rash noted. Psychiatric: Denies SI GU: Deferred   ____________________________________________   LABS (all labs ordered are listed, but only abnormal results are displayed)  Labs Reviewed  CBC WITH DIFFERENTIAL/PLATELET  COMPREHENSIVE METABOLIC PANEL   ____________________________________________   ED ECG REPORT I, Vanessa Baywood, the attending physician, personally viewed and interpreted this ECG.  Normal sinus rate of 73, no ST elevation, no T wave versions, normal intervals ____________________________________________  RADIOLOGY Robert Bellow, personally viewed and evaluated these images (plain radiographs) as part of my medical decision making, as well as reviewing the written report by the radiologist.  ED MD interpretation: No pneumonia  Official radiology report(s): DG Chest Portable 1 View  Result Date: 06/03/2021 CLINICAL DATA:  Alcohol intoxication EXAM: PORTABLE CHEST 1 VIEW COMPARISON:  06/16/2019 FINDINGS: Low volume chest with interstitial crowding. There is no edema, consolidation, effusion, or pneumothorax. Borderline heart size accentuated by technique. Negative aortic and hilar contours. Cholecystectomy clips. IMPRESSION: Limited low volume chest without acute  or focal finding. Electronically Signed   By: Monte Fantasia M.D.   On: 06/03/2021 05:14    ____________________________________________   PROCEDURES  Procedure(s) performed (including Critical Care):  .1-3 Lead EKG Interpretation  Date/Time: 06/03/2021 5:51 AM Performed by: Vanessa Panola, MD Authorized by: Vanessa Judith [place], MD     Interpretation: normal     ECG rate:  70s   ECG rate assessment: normal     Rhythm: sinus rhythm     Ectopy: none     Conduction: normal     ____________________________________________   INITIAL IMPRESSION / ASSESSMENT AND PLAN / ED COURSE  AKIMA SLAUGH was evaluated in Emergency Department on 06/03/2021 for the symptoms described in the history of present  illness. She was evaluated in the context of the global COVID-19 pandemic, which necessitated consideration that the patient might be at risk for infection with the SARS-CoV-2 virus that causes COVID-19. Institutional protocols and algorithms that pertain to the evaluation of patients at risk for COVID-19 are in a state of rapid change based on information released by regulatory bodies including the CDC and federal and state organizations. These policies and algorithms were followed during the patient's care in the ED.    Patient is a 50 year old who comes in with alcohol intoxication and being found down on the ground.  I suspect that this is all just secondary to the alcohol but given patient was found down on the ground will get CT head to make sure no evidence intracranial hemorrhage, skull fracture, cervical fracture.  Patient does not seem to be tender anywhere else on upon examination.  We will get some basic math labs to make sure no electrolyte abnormalities, AKI  Labs are reassuring.  Chest x-ray negative.  Patient be handed off to oncoming team pending sober reevaluation  7:19 AM reevaluated patient.  Patient is up walking around and states that she feels much better.  Patient reports having a ride home and is requesting discharge  I discussed the provisional nature of ED diagnosis, the treatment so far, the ongoing plan of care, follow up appointments and return precautions with the patient and any family or support people present. They expressed understanding and agreed with the plan, discharged home.          ____________________________________________   FINAL CLINICAL IMPRESSION(S) / ED DIAGNOSES   Final diagnoses:  Alcoholic intoxication without complication ([place])      MEDICATIONS GIVEN DURING THIS VISIT:  Medications  lactated ringers bolus 1,000 mL (1,000 mLs Intravenous New Bag/Given 06/03/21 0443)     ED Discharge Orders     None        Note:  This  document was prepared using Dragon voice recognition software and may include unintentional dictation errors.    Vanessa [place], MD 06/03/21 1761    Vanessa Bolt, MD 06/03/21 [phone]

## 2021-06-03 NOTE — ED Notes (Signed)
xr at bedside

## 2021-06-03 NOTE — ED Notes (Signed)
Pt ambulatory to RR at this time. No acute distress noted.

## 2021-06-03 NOTE — Discharge Instructions (Addendum)
Cut down  on your alcohol use.

## 2021-06-03 NOTE — ED Notes (Signed)
This RN at bedside. Pt given discharge instructions. Pt verbalizes understanding and unable to sign due to key pad not working. Pt's friend here for transportation.

## 2021-06-03 NOTE — ED Notes (Signed)
Pt stating she has friend in [place] that can come get her for discharge. MD made aware

## 2021-06-03 NOTE — ED Notes (Signed)
Pt ambulatory to RR RN standby assist.

## 2021-06-13 ENCOUNTER — Other Ambulatory Visit: Payer: Self-pay | Admitting: Internal Medicine

## 2021-06-13 DIAGNOSIS — Z1231 Encounter for screening mammogram for malignant neoplasm of breast: Secondary | ICD-10-CM

## 2021-08-10 ENCOUNTER — Other Ambulatory Visit: Payer: Self-pay

## 2021-08-10 ENCOUNTER — Ambulatory Visit
Admission: RE | Admit: 2021-08-10 | Discharge: 2021-08-10 | Disposition: A | Discharge status: Home / Self Care | Payer: 59 | Source: Ambulatory Visit | Attending: Internal Medicine | Admitting: Internal Medicine

## 2021-08-10 DIAGNOSIS — Z1231 Encounter for screening mammogram for malignant neoplasm of breast: Secondary | ICD-10-CM | POA: Diagnosis not present

## 2022-06-06 ENCOUNTER — Other Ambulatory Visit: Payer: Self-pay | Admitting: Certified Nurse Midwife

## 2022-06-06 DIAGNOSIS — Z1231 Encounter for screening mammogram for malignant neoplasm of breast: Secondary | ICD-10-CM

## 2022-07-09 ENCOUNTER — Other Ambulatory Visit: Payer: Self-pay | Admitting: Family Medicine

## 2022-07-09 DIAGNOSIS — M4802 Spinal stenosis, cervical region: Secondary | ICD-10-CM

## 2022-07-11 ENCOUNTER — Other Ambulatory Visit: Payer: Self-pay

## 2022-07-17 ENCOUNTER — Ambulatory Visit
Admission: RE | Admit: 2022-07-17 | Discharge: 2022-07-17 | Disposition: A | Discharge status: Home / Self Care | Payer: Commercial Managed Care - HMO | Source: Ambulatory Visit | Attending: Family Medicine | Admitting: Family Medicine

## 2022-07-17 DIAGNOSIS — M4802 Spinal stenosis, cervical region: Secondary | ICD-10-CM

## 2022-07-23 ENCOUNTER — Other Ambulatory Visit: Payer: Self-pay | Admitting: Family Medicine

## 2022-07-23 DIAGNOSIS — M5412 Radiculopathy, cervical region: Secondary | ICD-10-CM

## 2022-08-03 ENCOUNTER — Ambulatory Visit
Admission: RE | Admit: 2022-08-03 | Discharge: 2022-08-03 | Disposition: A | Discharge status: Home / Self Care | Payer: Commercial Managed Care - HMO | Source: Ambulatory Visit | Attending: Family Medicine | Admitting: Family Medicine

## 2022-08-03 DIAGNOSIS — M5412 Radiculopathy, cervical region: Secondary | ICD-10-CM

## 2022-08-03 MED ORDER — IOPAMIDOL (ISOVUE-M 300) INJECTION 61%
1.0000 mL | Freq: Once | INTRAMUSCULAR | Status: AC | PRN
  Administered 2022-08-03: 1 mL via EPIDURAL

## 2022-08-03 MED ORDER — TRIAMCINOLONE ACETONIDE 40 MG/ML IJ SUSP (RADIOLOGY)
60.0000 mg | Freq: Once | INTRAMUSCULAR | Status: AC
  Administered 2022-08-03: 60 mg via EPIDURAL

## 2022-08-03 NOTE — Discharge Instructions (Signed)

## 2022-08-16 ENCOUNTER — Ambulatory Visit
Admission: RE | Admit: 2022-08-16 | Discharge: 2022-08-16 | Disposition: A | Discharge status: Home / Self Care | Payer: Commercial Managed Care - HMO | Source: Ambulatory Visit | Attending: Certified Nurse Midwife | Admitting: Certified Nurse Midwife

## 2022-08-16 DIAGNOSIS — Z1231 Encounter for screening mammogram for malignant neoplasm of breast: Secondary | ICD-10-CM | POA: Diagnosis present

## 2022-08-20 ENCOUNTER — Other Ambulatory Visit: Payer: Self-pay

## 2022-08-20 ENCOUNTER — Telehealth: Payer: Self-pay

## 2022-08-20 ENCOUNTER — Ambulatory Visit
Admission: RE | Admit: 2022-08-20 | Discharge: 2022-08-20 | Disposition: A | Discharge status: Home / Self Care | Payer: Self-pay | Source: Ambulatory Visit | Attending: Neurosurgery | Admitting: Neurosurgery

## 2022-08-20 DIAGNOSIS — Z049 Encounter for examination and observation for unspecified reason: Secondary | ICD-10-CM

## 2022-08-20 NOTE — Telephone Encounter (Signed)
Left message to call back  

## 2022-08-20 NOTE — Telephone Encounter (Signed)
-----   Message from Peggyann Shoals sent at 08/20/2022 10:28 AM EDT ----- Regarding: new pt Contact: [phone] Patient is calling that she would like to see Dr.Yarbrough. She has heard a lot of good things about him. She has had 1 injection, MRI cervical, but no PT. She feels that she is at the point where surgery is her only option. Physiatry note documents weakness. Can she see Dr.Yarbrough? If she has to she will try PT.

## 2022-08-20 NOTE — Telephone Encounter (Signed)
It is very likely that she will have to do to physical therapy, but she is welcome to come in to meet with Dr Izora Ribas to go over her imaging and discuss the options.  Please scan her xray report from [facility] into our system.

## 2022-08-22 NOTE — Telephone Encounter (Signed)
She confirmed for 09/25/2022.

## 2022-09-24 NOTE — Progress Notes (Unsigned)
Referring Physician:  No referring provider defined for this encounter.  Primary Physician:  Tracie Harrier, MD  History of Present Illness: 09/25/2022 [name] is here today with a chief complaint of tightness in her neck, numbness into her right arm, and discomfort into her hand.  This began approximate 2 years ago and has worsened over the past several months.  She reports tightness, dullness, stiffness, aching, and burning that is made worse by lifting and typing.  Ice and rest have helped.  Ibuprofen has helped.  Her neck pain is constant, while her arm pain is intermittent.   Bowel/Bladder Dysfunction: none  Conservative measures: ice, heat Physical therapy:  has not participated Multimodal medical therapy including regular antiinflammatories: flexeril, gabapentin, mobic, prednisone, skelaxin, ibuprofen Injections:  has received epidural steroid injections 08/03/22: C7-T1 IL ESI at [facility]  Past Surgery:  denies  Heather Faulkner has no symptoms of cervical myelopathy.  The symptoms are causing a significant impact on the patient's life.   Review of Systems:  A 10 point review of systems is negative, except for the pertinent positives and negatives detailed in the HPI.  Past Medical History: Past Medical History:  Diagnosis Date   Allergic rhinitis    Anxiety    Asthma    Depression    GERD (gastroesophageal reflux disease)    Hypersomnia with sleep apnea    Hypothyroidism    Irritable bowel syndrome    OSA (obstructive sleep apnea)    Perioral dermatitis    Persistent hypersomnia    Tubular adenoma     Past Surgical History: Past Surgical History:  Procedure Laterality Date   BREAST BIOPSY Left 10/01/2019   [place] BX, heart marker, PASH   BUNIONECTOMY Right    cesarean     x 2   CESAREAN SECTION     CHOLECYSTECTOMY     COLONOSCOPY     x 2   ESOPHAGOGASTRODUODENOSCOPY     nasal surgery (polyps)     x2    Allergies: Allergies  as of 09/25/2022 - Review Complete 09/25/2022  Allergen Reaction Noted   Ketoconazole Other (See Comments) 04/18/2018    Medications: Current Meds  Medication Sig   ergocalciferol (VITAMIN D2) 1.25 MG (50000 UT) capsule Take 1 capsule by mouth once a week.   meloxicam (MOBIC) 7.5 MG tablet Take 7.5 mg by mouth daily as needed for pain.    Social History: Social History   Tobacco Use   Smoking status: Former    Types: Cigarettes   Smokeless tobacco: Never   Tobacco comments:    social in college  Substance Use Topics   Alcohol use: Yes    Alcohol/week: 0.0 standard drinks of alcohol    Comment: occ 2-3 x week   Drug use: No    Family Medical History: Family History  Problem Relation Age of Onset   Hypertension Mother    Hyperlipidemia Mother    Polymyalgia rheumatica Mother    Hypertension Father    Breast cancer Other    Diabetes Maternal Grandfather    Heart attack Maternal Grandfather    Stroke Paternal Grandmother    Celiac disease Daughter    Breast cancer Cousin     Physical Examination: Vitals:   09/25/22 1304  BP: 120/82  Pulse: 77    General: Patient is well developed, well nourished, calm, collected, and in no apparent distress. Attention to examination is appropriate.  Neck:   Supple.  Limited range of  motion.  Respiratory: Patient is breathing without any difficulty.   NEUROLOGICAL:     Awake, alert, oriented to person, place, and time.  Speech is clear and fluent. Fund of knowledge is appropriate.   Cranial Nerves: Pupils equal round and reactive to light.  Facial tone is symmetric.  Facial sensation is symmetric. Shoulder shrug is symmetric. Tongue protrusion is midline.  There is no pronator drift.  ROM of spine: full.    Strength: Side Biceps Triceps Deltoid Interossei Grip Wrist Ext. Wrist Flex.  R '5 5 5 5 5 5 5  '$ L '5 5 5 5 5 5 5   '$ Side Iliopsoas Quads Hamstring PF DF EHL  R '5 5 5 5 5 5  '$ L '5 5 5 5 5 5   '$ Reflexes are 1+ and  symmetric at the biceps, triceps, brachioradialis, patella and achilles.   Hoffman's is absent.   Bilateral upper and lower extremity sensation is intact to light touch.    No evidence of dysmetria noted.  Gait is normal.     Medical Decision Making  Imaging: MRI C spine 07/18/22 C3-4: Small central disc protrusion without spinal cord mass effect or significant spinal stenosis. Borderline to mild right neural foraminal stenosis due to uncovertebral spurring.   C4-5: Broad-based posterior disc osteophyte complex results in borderline spinal stenosis and moderate to severe left greater than right neural foraminal stenosis with potential bilateral C5 nerve root impingement.   C5-6: Broad-based posterior disc osteophyte complex results in borderline spinal stenosis and moderate right and severe left neural foraminal stenosis with potential bilateral C6 nerve root impingement.   C6-7: Mild disc bulging and a small right foraminal disc protrusion (series 11, image 25) result in moderate right neural foraminal stenosis with potential right C7 nerve root impingement. Patent spinal canal and left neural foramen.   C7-T1: Mild left facet arthrosis without disc herniation or stenosis. IMPRESSION: 1. Multilevel disc degeneration, most advanced at C4-5 and C5-6 where there is moderate to severe neural foraminal stenosis. 2. Moderate right neural foraminal stenosis at C6-7 due to a disc protrusion.     Electronically Signed   By: Logan Bores M.D.   On: 07/18/2022 13:07  I have personally reviewed the images and agree with the above interpretation.  Assessment and Plan: Heather Faulkner is a pleasant 51 y.o. female with neck pain with cervical radiculopathy.  This seems consistent with C5-C7 involvement.  This is where she has neuroforaminal stenosis and has substantial cervical spondylosis at C4-5 and C5-6.  At this point, I recommended that she start with physical therapy.  If that  does not help her condition, we will discuss C4-7 anterior cervical discectomy and fusion.   I spent a total of 30 minutes in face-to-face and non-face-to-face activities related to this patient's care today.  Thank you for involving me in the care of this patient.      Bralynn Velador K. Izora Ribas MD, [facility]

## 2022-09-25 ENCOUNTER — Encounter: Payer: Self-pay | Admitting: Neurosurgery

## 2022-09-25 ENCOUNTER — Ambulatory Visit: Payer: Commercial Managed Care - HMO | Admitting: Neurosurgery

## 2022-09-25 VITALS — BP 120/82 | HR 77 | Ht 63.0 in | Wt 174.2 lb

## 2022-09-25 DIAGNOSIS — G8929 Other chronic pain: Secondary | ICD-10-CM

## 2022-09-25 DIAGNOSIS — M5412 Radiculopathy, cervical region: Secondary | ICD-10-CM

## 2022-09-25 DIAGNOSIS — M25511 Pain in right shoulder: Secondary | ICD-10-CM | POA: Diagnosis not present

## 2022-12-04 ENCOUNTER — Ambulatory Visit: Payer: Commercial Managed Care - HMO | Admitting: Neurosurgery

## 2022-12-25 DIAGNOSIS — R7309 Other abnormal glucose: Secondary | ICD-10-CM | POA: Diagnosis not present

## 2023-01-02 DIAGNOSIS — R053 Chronic cough: Secondary | ICD-10-CM | POA: Insufficient documentation

## 2023-02-06 DIAGNOSIS — Z13820 Encounter for screening for osteoporosis: Secondary | ICD-10-CM | POA: Diagnosis not present

## 2023-02-06 DIAGNOSIS — R768 Other specified abnormal immunological findings in serum: Secondary | ICD-10-CM | POA: Diagnosis not present

## 2023-02-06 DIAGNOSIS — M7062 Trochanteric bursitis, left hip: Secondary | ICD-10-CM | POA: Diagnosis not present

## 2023-02-06 DIAGNOSIS — M791 Myalgia, unspecified site: Secondary | ICD-10-CM | POA: Diagnosis not present

## 2023-03-06 DIAGNOSIS — G4733 Obstructive sleep apnea (adult) (pediatric): Secondary | ICD-10-CM | POA: Diagnosis not present

## 2023-03-13 DIAGNOSIS — Z780 Asymptomatic menopausal state: Secondary | ICD-10-CM | POA: Diagnosis not present

## 2023-03-22 DIAGNOSIS — R0602 Shortness of breath: Secondary | ICD-10-CM | POA: Diagnosis not present

## 2023-03-22 DIAGNOSIS — M2560 Stiffness of unspecified joint, not elsewhere classified: Secondary | ICD-10-CM | POA: Diagnosis not present

## 2023-03-25 DIAGNOSIS — G4733 Obstructive sleep apnea (adult) (pediatric): Secondary | ICD-10-CM | POA: Diagnosis not present

## 2023-03-27 DIAGNOSIS — K219 Gastro-esophageal reflux disease without esophagitis: Secondary | ICD-10-CM | POA: Diagnosis not present

## 2023-03-27 DIAGNOSIS — R0981 Nasal congestion: Secondary | ICD-10-CM | POA: Diagnosis not present

## 2023-03-27 DIAGNOSIS — J309 Allergic rhinitis, unspecified: Secondary | ICD-10-CM | POA: Diagnosis not present

## 2023-03-27 DIAGNOSIS — J329 Chronic sinusitis, unspecified: Secondary | ICD-10-CM | POA: Diagnosis not present

## 2023-03-27 DIAGNOSIS — R059 Cough, unspecified: Secondary | ICD-10-CM | POA: Diagnosis not present

## 2023-04-02 DIAGNOSIS — J301 Allergic rhinitis due to pollen: Secondary | ICD-10-CM | POA: Diagnosis not present

## 2023-04-19 DIAGNOSIS — B9689 Other specified bacterial agents as the cause of diseases classified elsewhere: Secondary | ICD-10-CM | POA: Diagnosis not present

## 2023-04-19 DIAGNOSIS — Z03818 Encounter for observation for suspected exposure to other biological agents ruled out: Secondary | ICD-10-CM | POA: Diagnosis not present

## 2023-04-19 DIAGNOSIS — J4521 Mild intermittent asthma with (acute) exacerbation: Secondary | ICD-10-CM | POA: Diagnosis not present

## 2023-04-19 DIAGNOSIS — J019 Acute sinusitis, unspecified: Secondary | ICD-10-CM | POA: Diagnosis not present

## 2023-04-19 DIAGNOSIS — J209 Acute bronchitis, unspecified: Secondary | ICD-10-CM | POA: Diagnosis not present

## 2023-04-24 DIAGNOSIS — G4733 Obstructive sleep apnea (adult) (pediatric): Secondary | ICD-10-CM | POA: Diagnosis not present

## 2023-05-07 DIAGNOSIS — M25541 Pain in joints of right hand: Secondary | ICD-10-CM | POA: Diagnosis not present

## 2023-05-07 DIAGNOSIS — D649 Anemia, unspecified: Secondary | ICD-10-CM | POA: Diagnosis not present

## 2023-05-07 DIAGNOSIS — Z6831 Body mass index (BMI) 31.0-31.9, adult: Secondary | ICD-10-CM | POA: Diagnosis not present

## 2023-05-07 DIAGNOSIS — R0982 Postnasal drip: Secondary | ICD-10-CM | POA: Diagnosis not present

## 2023-05-07 DIAGNOSIS — R252 Cramp and spasm: Secondary | ICD-10-CM | POA: Diagnosis not present

## 2023-05-07 DIAGNOSIS — F3340 Major depressive disorder, recurrent, in remission, unspecified: Secondary | ICD-10-CM | POA: Diagnosis not present

## 2023-05-07 DIAGNOSIS — F39 Unspecified mood [affective] disorder: Secondary | ICD-10-CM | POA: Diagnosis not present

## 2023-05-07 DIAGNOSIS — M25542 Pain in joints of left hand: Secondary | ICD-10-CM | POA: Diagnosis not present

## 2023-05-07 DIAGNOSIS — G2581 Restless legs syndrome: Secondary | ICD-10-CM | POA: Diagnosis not present

## 2023-05-07 DIAGNOSIS — J4540 Moderate persistent asthma, uncomplicated: Secondary | ICD-10-CM | POA: Diagnosis not present

## 2023-05-07 DIAGNOSIS — F411 Generalized anxiety disorder: Secondary | ICD-10-CM | POA: Diagnosis not present

## 2023-05-07 DIAGNOSIS — R7989 Other specified abnormal findings of blood chemistry: Secondary | ICD-10-CM | POA: Diagnosis not present

## 2023-05-07 DIAGNOSIS — R0683 Snoring: Secondary | ICD-10-CM | POA: Diagnosis not present

## 2023-05-14 DIAGNOSIS — R053 Chronic cough: Secondary | ICD-10-CM | POA: Diagnosis not present

## 2023-05-14 DIAGNOSIS — R0602 Shortness of breath: Secondary | ICD-10-CM | POA: Diagnosis not present

## 2023-05-14 DIAGNOSIS — E039 Hypothyroidism, unspecified: Secondary | ICD-10-CM | POA: Diagnosis not present

## 2023-05-14 DIAGNOSIS — D649 Anemia, unspecified: Secondary | ICD-10-CM | POA: Diagnosis not present

## 2023-05-14 DIAGNOSIS — R490 Dysphonia: Secondary | ICD-10-CM | POA: Diagnosis not present

## 2023-05-14 DIAGNOSIS — R140 Abdominal distension (gaseous): Secondary | ICD-10-CM | POA: Diagnosis not present

## 2023-05-14 DIAGNOSIS — Z6831 Body mass index (BMI) 31.0-31.9, adult: Secondary | ICD-10-CM | POA: Diagnosis not present

## 2023-05-14 DIAGNOSIS — G4733 Obstructive sleep apnea (adult) (pediatric): Secondary | ICD-10-CM | POA: Diagnosis not present

## 2023-05-14 DIAGNOSIS — F411 Generalized anxiety disorder: Secondary | ICD-10-CM | POA: Diagnosis not present

## 2023-05-14 DIAGNOSIS — Z0000 Encounter for general adult medical examination without abnormal findings: Secondary | ICD-10-CM | POA: Diagnosis not present

## 2023-05-22 DIAGNOSIS — R0602 Shortness of breath: Secondary | ICD-10-CM | POA: Diagnosis not present

## 2023-05-22 DIAGNOSIS — R0981 Nasal congestion: Secondary | ICD-10-CM | POA: Diagnosis not present

## 2023-05-22 DIAGNOSIS — J3089 Other allergic rhinitis: Secondary | ICD-10-CM | POA: Diagnosis not present

## 2023-05-25 DIAGNOSIS — G4733 Obstructive sleep apnea (adult) (pediatric): Secondary | ICD-10-CM | POA: Diagnosis not present

## 2023-05-29 DIAGNOSIS — K219 Gastro-esophageal reflux disease without esophagitis: Secondary | ICD-10-CM | POA: Diagnosis not present

## 2023-05-29 DIAGNOSIS — J4520 Mild intermittent asthma, uncomplicated: Secondary | ICD-10-CM | POA: Diagnosis not present

## 2023-05-29 DIAGNOSIS — J45909 Unspecified asthma, uncomplicated: Secondary | ICD-10-CM | POA: Diagnosis not present

## 2023-05-29 DIAGNOSIS — R0609 Other forms of dyspnea: Secondary | ICD-10-CM | POA: Diagnosis not present

## 2023-05-29 DIAGNOSIS — G4733 Obstructive sleep apnea (adult) (pediatric): Secondary | ICD-10-CM | POA: Diagnosis not present

## 2023-05-29 DIAGNOSIS — R053 Chronic cough: Secondary | ICD-10-CM | POA: Diagnosis not present

## 2023-05-29 DIAGNOSIS — F519 Sleep disorder not due to a substance or known physiological condition, unspecified: Secondary | ICD-10-CM | POA: Diagnosis not present

## 2023-05-29 DIAGNOSIS — E669 Obesity, unspecified: Secondary | ICD-10-CM | POA: Diagnosis not present

## 2023-05-31 ENCOUNTER — Ambulatory Visit
Admission: RE | Admit: 2023-05-31 | Discharge: 2023-05-31 | Disposition: A | Discharge status: Home / Self Care | Payer: BLUE CROSS/BLUE SHIELD | Source: Ambulatory Visit | Attending: Internal Medicine | Admitting: Internal Medicine

## 2023-05-31 ENCOUNTER — Other Ambulatory Visit: Payer: Self-pay | Admitting: Internal Medicine

## 2023-05-31 DIAGNOSIS — R0609 Other forms of dyspnea: Secondary | ICD-10-CM

## 2023-06-12 ENCOUNTER — Other Ambulatory Visit: Payer: Self-pay | Admitting: Certified Nurse Midwife

## 2023-06-12 DIAGNOSIS — Z01411 Encounter for gynecological examination (general) (routine) with abnormal findings: Secondary | ICD-10-CM | POA: Diagnosis not present

## 2023-06-12 DIAGNOSIS — Z124 Encounter for screening for malignant neoplasm of cervix: Secondary | ICD-10-CM | POA: Diagnosis not present

## 2023-06-12 DIAGNOSIS — Z1231 Encounter for screening mammogram for malignant neoplasm of breast: Secondary | ICD-10-CM

## 2023-06-12 DIAGNOSIS — R140 Abdominal distension (gaseous): Secondary | ICD-10-CM | POA: Diagnosis not present

## 2023-06-12 DIAGNOSIS — R194 Change in bowel habit: Secondary | ICD-10-CM | POA: Diagnosis not present

## 2023-06-12 DIAGNOSIS — R1900 Intra-abdominal and pelvic swelling, mass and lump, unspecified site: Secondary | ICD-10-CM | POA: Diagnosis not present

## 2023-06-19 DIAGNOSIS — R0609 Other forms of dyspnea: Secondary | ICD-10-CM | POA: Diagnosis not present

## 2023-06-24 DIAGNOSIS — R0602 Shortness of breath: Secondary | ICD-10-CM | POA: Diagnosis not present

## 2023-06-24 DIAGNOSIS — G4733 Obstructive sleep apnea (adult) (pediatric): Secondary | ICD-10-CM | POA: Diagnosis not present

## 2023-07-18 DIAGNOSIS — F411 Generalized anxiety disorder: Secondary | ICD-10-CM | POA: Diagnosis not present

## 2023-07-25 DIAGNOSIS — G4733 Obstructive sleep apnea (adult) (pediatric): Secondary | ICD-10-CM | POA: Diagnosis not present

## 2023-07-27 DIAGNOSIS — F411 Generalized anxiety disorder: Secondary | ICD-10-CM | POA: Diagnosis not present

## 2023-08-07 DIAGNOSIS — J4540 Moderate persistent asthma, uncomplicated: Secondary | ICD-10-CM | POA: Diagnosis not present

## 2023-08-13 DIAGNOSIS — R0609 Other forms of dyspnea: Secondary | ICD-10-CM | POA: Diagnosis not present

## 2023-08-13 DIAGNOSIS — E669 Obesity, unspecified: Secondary | ICD-10-CM | POA: Diagnosis not present

## 2023-08-13 DIAGNOSIS — F519 Sleep disorder not due to a substance or known physiological condition, unspecified: Secondary | ICD-10-CM | POA: Diagnosis not present

## 2023-08-13 DIAGNOSIS — J45909 Unspecified asthma, uncomplicated: Secondary | ICD-10-CM | POA: Diagnosis not present

## 2023-08-13 DIAGNOSIS — R053 Chronic cough: Secondary | ICD-10-CM | POA: Diagnosis not present

## 2023-08-13 DIAGNOSIS — K219 Gastro-esophageal reflux disease without esophagitis: Secondary | ICD-10-CM | POA: Diagnosis not present

## 2023-08-13 DIAGNOSIS — G4733 Obstructive sleep apnea (adult) (pediatric): Secondary | ICD-10-CM | POA: Diagnosis not present

## 2023-08-13 DIAGNOSIS — J4520 Mild intermittent asthma, uncomplicated: Secondary | ICD-10-CM | POA: Diagnosis not present

## 2023-08-19 ENCOUNTER — Ambulatory Visit
Admission: RE | Admit: 2023-08-19 | Discharge: 2023-08-19 | Disposition: A | Discharge status: Home / Self Care | Payer: 59 | Source: Ambulatory Visit | Attending: Certified Nurse Midwife | Admitting: Certified Nurse Midwife

## 2023-08-19 DIAGNOSIS — H0288A Meibomian gland dysfunction right eye, upper and lower eyelids: Secondary | ICD-10-CM | POA: Diagnosis not present

## 2023-08-19 DIAGNOSIS — Z1231 Encounter for screening mammogram for malignant neoplasm of breast: Secondary | ICD-10-CM | POA: Diagnosis not present

## 2023-08-25 DIAGNOSIS — G4733 Obstructive sleep apnea (adult) (pediatric): Secondary | ICD-10-CM | POA: Diagnosis not present

## 2023-08-26 ENCOUNTER — Telehealth: Payer: Self-pay

## 2023-08-26 NOTE — Telephone Encounter (Signed)
Patient left a voicemail wanting to know if she could become a new patient with our office. Informed her she could but we would need a referral from her PCP office referring her to our office. Asked her did she not want to see [facility] who she saw 5 years ago. She said she didn't have any problem with them but they can not see her till December and she wants to be seen before them. She states she will get PCP to place a referral

## 2023-08-27 DIAGNOSIS — R1900 Intra-abdominal and pelvic swelling, mass and lump, unspecified site: Secondary | ICD-10-CM | POA: Diagnosis not present

## 2023-08-27 DIAGNOSIS — R140 Abdominal distension (gaseous): Secondary | ICD-10-CM | POA: Diagnosis not present

## 2023-08-27 DIAGNOSIS — R194 Change in bowel habit: Secondary | ICD-10-CM | POA: Diagnosis not present

## 2023-08-27 DIAGNOSIS — N951 Menopausal and female climacteric states: Secondary | ICD-10-CM | POA: Diagnosis not present

## 2023-09-11 DIAGNOSIS — G4733 Obstructive sleep apnea (adult) (pediatric): Secondary | ICD-10-CM | POA: Diagnosis not present

## 2023-09-11 DIAGNOSIS — Z0000 Encounter for general adult medical examination without abnormal findings: Secondary | ICD-10-CM | POA: Diagnosis not present

## 2023-09-11 DIAGNOSIS — D649 Anemia, unspecified: Secondary | ICD-10-CM | POA: Diagnosis not present

## 2023-09-11 DIAGNOSIS — R0602 Shortness of breath: Secondary | ICD-10-CM | POA: Diagnosis not present

## 2023-09-11 DIAGNOSIS — F411 Generalized anxiety disorder: Secondary | ICD-10-CM | POA: Diagnosis not present

## 2023-09-11 DIAGNOSIS — R053 Chronic cough: Secondary | ICD-10-CM | POA: Diagnosis not present

## 2023-09-11 DIAGNOSIS — Z6831 Body mass index (BMI) 31.0-31.9, adult: Secondary | ICD-10-CM | POA: Diagnosis not present

## 2023-09-11 DIAGNOSIS — R490 Dysphonia: Secondary | ICD-10-CM | POA: Diagnosis not present

## 2023-09-11 DIAGNOSIS — E039 Hypothyroidism, unspecified: Secondary | ICD-10-CM | POA: Diagnosis not present

## 2023-09-11 DIAGNOSIS — R140 Abdominal distension (gaseous): Secondary | ICD-10-CM | POA: Diagnosis not present

## 2023-09-18 DIAGNOSIS — Z2821 Immunization not carried out because of patient refusal: Secondary | ICD-10-CM | POA: Diagnosis not present

## 2023-09-18 DIAGNOSIS — R140 Abdominal distension (gaseous): Secondary | ICD-10-CM | POA: Diagnosis not present

## 2023-09-18 DIAGNOSIS — R490 Dysphonia: Secondary | ICD-10-CM | POA: Diagnosis not present

## 2023-09-18 DIAGNOSIS — G4733 Obstructive sleep apnea (adult) (pediatric): Secondary | ICD-10-CM | POA: Diagnosis not present

## 2023-09-18 DIAGNOSIS — R053 Chronic cough: Secondary | ICD-10-CM | POA: Diagnosis not present

## 2023-09-18 DIAGNOSIS — K219 Gastro-esophageal reflux disease without esophagitis: Secondary | ICD-10-CM | POA: Diagnosis not present

## 2023-09-18 DIAGNOSIS — K589 Irritable bowel syndrome without diarrhea: Secondary | ICD-10-CM | POA: Diagnosis not present

## 2023-09-18 DIAGNOSIS — Z6831 Body mass index (BMI) 31.0-31.9, adult: Secondary | ICD-10-CM | POA: Diagnosis not present

## 2023-09-18 DIAGNOSIS — B9789 Other viral agents as the cause of diseases classified elsewhere: Secondary | ICD-10-CM | POA: Diagnosis not present

## 2023-09-18 DIAGNOSIS — J028 Acute pharyngitis due to other specified organisms: Secondary | ICD-10-CM | POA: Diagnosis not present

## 2023-09-24 DIAGNOSIS — G4733 Obstructive sleep apnea (adult) (pediatric): Secondary | ICD-10-CM | POA: Diagnosis not present

## 2023-10-03 DIAGNOSIS — M542 Cervicalgia: Secondary | ICD-10-CM | POA: Diagnosis not present

## 2023-10-10 ENCOUNTER — Ambulatory Visit: Payer: 59 | Admitting: Gastroenterology

## 2023-10-10 ENCOUNTER — Other Ambulatory Visit: Payer: Self-pay | Admitting: Gastroenterology

## 2023-10-10 ENCOUNTER — Encounter: Payer: Self-pay | Admitting: Gastroenterology

## 2023-10-10 VITALS — BP 118/69 | HR 70 | Temp 98.5°F | Ht 62.0 in | Wt 173.0 lb

## 2023-10-10 DIAGNOSIS — K219 Gastro-esophageal reflux disease without esophagitis: Secondary | ICD-10-CM | POA: Diagnosis not present

## 2023-10-10 DIAGNOSIS — Z860100 Personal history of colon polyps, unspecified: Secondary | ICD-10-CM | POA: Diagnosis not present

## 2023-10-10 MED ORDER — NA SULFATE-K SULFATE-MG SULF 17.5-3.13-1.6 GM/177ML PO SOLN
1.0000 | Freq: Once | ORAL | 0 refills | Status: AC
Start: 2023-10-10 — End: 2023-10-10

## 2023-10-10 MED ORDER — ESOMEPRAZOLE MAGNESIUM 40 MG PO CPDR: 40.0000 mg | DELAYED_RELEASE_CAPSULE | Freq: Two times a day (BID) | ORAL | 3 refills | Status: DC

## 2023-10-10 NOTE — Progress Notes (Signed)
Gastroenterology Consultation  Referring Provider:     Barbette Reichmann, MD Primary Care Physician:  Barbette Reichmann, MD Primary Gastroenterologist:  Dr. Servando Snare     Reason for Consultation:     Abdominal pain bloating and GERD        HPI:   Heather Faulkner is a 52 y.o. y/o female referred for consultation & management of abdominal pain bloating and GERD by Dr. Barbette Reichmann, MD. This patient comes in today after being seen in the past by [facility].  The patient has had EGDs and colonoscopies in the past with her last EGD and colonoscopy back in 2018.  The patient reports that she has a history of colon polyps but the reports are not accessible to me on epic or Care Everywhere since they were done at a privately owned endoscopy center in town. She reports her voice has changed over the last year. She also has a chronic cough and has seen pulm, ENT and ENT at [facility].  The patient also states that she started on the CPAP for sleep apnea.  There is no report of any unexplained weight loss fevers chills nausea vomiting, black stools or bloody stools.  She does recall that she was started on Protonix but years ago and it caused her significant abdominal pain.  She had been on 40 mg of omeprazole prior to that that she does not remember if she L e. there is no report of any dysphagia.  She had an appoint with [place] because she had gotten a message from her GI doctor at [facility] that her appointment was going to be in December and then it was rescheduled for tomorrow therefore she is now here to  Past Medical History:  Diagnosis Date   Allergic rhinitis    Anxiety    Asthma    Depression    GERD (gastroesophageal reflux disease)    Hypersomnia with sleep apnea    Hypothyroidism    Irritable bowel syndrome    OSA (obstructive sleep apnea)    Perioral dermatitis    Persistent hypersomnia    Tubular adenoma     Past Surgical History:  Procedure Laterality Date   BREAST BIOPSY  Left 10/01/2019   [place] BX, heart marker, PASH   BUNIONECTOMY Right    cesarean     x 2   CESAREAN SECTION     CHOLECYSTECTOMY     COLONOSCOPY     x 2   ESOPHAGOGASTRODUODENOSCOPY     nasal surgery (polyps)     x2    Prior to Admission medications   Medication Sig Start Date End Date Taking? Authorizing Provider  ADVAIR HFA 115-21 MCG/ACT inhaler INHALE 2 INHALATIONS INTO THE LUNGS EVERY 12 (TWELVE) HOURS. 11/22/17   [provider]  albuterol (VENTOLIN HFA) 108 (90 BASE) MCG/ACT inhaler Inhale into the lungs every 6 (six) hours as needed.     [provider]  chlorpheniramine-HYDROcodone (TUSSIONEX PENNKINETIC ER) 10-8 MG/5ML SUER Take 5 mLs by mouth 2 (two) times daily. 06/16/19   Emily Filbert, MD  ergocalciferol (VITAMIN D2) 1.25 MG (50000 UT) capsule Take 1 capsule by mouth once a week. 11/14/21   [provider]  FLUoxetine (PROZAC) 10 MG capsule Take by mouth daily. 01/31/18   [provider]  lamoTRIgine (LAMICTAL) 200 MG tablet Take 200 mg by mouth daily. 01/31/18   [provider]  meloxicam (MOBIC) 7.5 MG tablet Take 7.5 mg by mouth daily as  needed for pain.    [provider]  montelukast (SINGULAIR) 10 MG tablet TAKE ONE TABLET BY MOUTH DAILY 08/17/14   [provider]  Probiotic Product (PROBIOTIC ADVANCED PO) Take by mouth daily.    [provider]  SYNTHROID 112 MCG tablet TAKE 1 TAB BY MOUTH DAILY. TAKE ON AN EMPTY STOMACH W/ GLASS OF WATER AT LEAST 30-60MIN B4 BREAKFAST 06/10/17   [provider]  traZODone (DESYREL) 50 MG tablet Take 50-75 mg by mouth at bedtime as needed. 09/11/22   [provider]  Monte Fantasia INHUB 250-50 MCG/ACT AEPB Inhale into the lungs. 06/14/22   [provider]    Family History  Problem Relation Age of Onset   Hypertension Mother    Hyperlipidemia Mother    Polymyalgia rheumatica Mother    Hypertension Father    Breast cancer Other    Diabetes  Maternal Grandfather    Heart attack Maternal Grandfather    Stroke Paternal Grandmother    Celiac disease Daughter    Breast cancer Cousin      Social History   Tobacco Use   Smoking status: Former    Types: Cigarettes   Smokeless tobacco: Never   Tobacco comments:    social in college  Substance Use Topics   Alcohol use: Yes    Alcohol/week: 0.0 standard drinks of alcohol    Comment: occ 2-3 x week   Drug use: No    Allergies as of 10/10/2023 - Review Complete 09/25/2022  Allergen Reaction Noted   Ketoconazole Other (See Comments) 04/18/2018    Review of Systems:    All systems reviewed and negative except where noted in HPI.   Physical Exam:  There were no vitals taken for this visit. No LMP recorded. Patient is perimenopausal. General:   Alert,  Well-developed, well-nourished, pleasant and cooperative in NAD Head:  Normocephalic and atraumatic. Eyes:  Sclera clear, no icterus.   Conjunctiva pink. Ears:  Normal auditory acuity. Neck:  Supple; no masses or thyromegaly. Lungs:  Respirations even and unlabored.  Clear throughout to auscultation.   No wheezes, crackles, or rhonchi. No acute distress. Heart:  Regular rate and rhythm; no murmurs, clicks, rubs, or gallops. Abdomen:  Normal bowel sounds.  No bruits.  Soft, non-tender and non-distended without masses, hepatosplenomegaly or hernias noted.  No guarding or rebound tenderness.  Negative Carnett sign.   Rectal:  Deferred.  Pulses:  Normal pulses noted. Extremities:  No clubbing or edema.  No cyanosis. Neurologic:  Alert and oriented x3;  grossly normal neurologically. Skin:  Intact without significant lesions or rashes.  No jaundice. Lymph Nodes:  No significant cervical adenopathy. Psych:  Alert and cooperative. Normal mood and affect.  Imaging Studies: No results found.  Assessment and Plan:   Heather Faulkner is a 52 y.o. y/o female with a history of colon polyps and in need of a surveillance colonoscopy.   The patient also has symptomatic reflux with globus and will be set up for an EGD at the same time.  The patient has been told to try a high-dose PPI for 3 to 4 weeks to see if this makes her symptoms better.  The patient has been told that if the PPI and high-dose does not resolve her symptoms it is unlikely that it is acid mediated.  Patient has been explained the plan and agrees with it.    Midge Minium, MD. Clement[facility] Graham    Note: This dictation was prepared with Dragon dictation along  with smaller phrase technology. Any transcriptional errors that result from this process are unintentional.

## 2023-10-22 DIAGNOSIS — M791 Myalgia, unspecified site: Secondary | ICD-10-CM | POA: Diagnosis not present

## 2023-10-22 DIAGNOSIS — M7061 Trochanteric bursitis, right hip: Secondary | ICD-10-CM | POA: Diagnosis not present

## 2023-10-22 DIAGNOSIS — R768 Other specified abnormal immunological findings in serum: Secondary | ICD-10-CM | POA: Diagnosis not present

## 2023-10-22 DIAGNOSIS — M25562 Pain in left knee: Secondary | ICD-10-CM | POA: Diagnosis not present

## 2023-10-22 DIAGNOSIS — M7062 Trochanteric bursitis, left hip: Secondary | ICD-10-CM | POA: Diagnosis not present

## 2023-10-25 DIAGNOSIS — G4733 Obstructive sleep apnea (adult) (pediatric): Secondary | ICD-10-CM | POA: Diagnosis not present

## 2023-10-30 DIAGNOSIS — F5101 Primary insomnia: Secondary | ICD-10-CM | POA: Diagnosis not present

## 2023-10-30 DIAGNOSIS — F3342 Major depressive disorder, recurrent, in full remission: Secondary | ICD-10-CM | POA: Diagnosis not present

## 2023-10-30 DIAGNOSIS — F411 Generalized anxiety disorder: Secondary | ICD-10-CM | POA: Diagnosis not present

## 2023-11-04 ENCOUNTER — Encounter: Payer: Self-pay | Admitting: Gastroenterology

## 2023-11-04 NOTE — Anesthesia Preprocedure Evaluation (Addendum)
Anesthesia Evaluation  Patient identified by MRN, date of birth, ID band Patient awake    Reviewed: Allergy & Precautions, H&P , NPO status , Patient's Chart, lab work & pertinent test results  History of Anesthesia Complications (+) history of anesthetic complications  Airway Mallampati: III  TM Distance: >3 FB Neck ROM: Full    Dental no notable dental hx.    Pulmonary asthma , sleep apnea , former smoker   Pulmonary exam normal breath sounds clear to auscultation       Cardiovascular negative cardio ROS Normal cardiovascular exam Rhythm:Regular Rate:Normal     Neuro/Psych  PSYCHIATRIC DISORDERS Anxiety Depression    negative neurological ROS  negative psych ROS   GI/Hepatic negative GI ROS, Neg liver ROS,GERD  ,,  Endo/Other  negative endocrine ROSHypothyroidism    Renal/GU negative Renal ROS  negative genitourinary   Musculoskeletal negative musculoskeletal ROS (+) Arthritis ,    Abdominal   Peds negative pediatric ROS (+)  Hematology negative hematology ROS (+) Blood dyscrasia, anemia   Anesthesia Other Findings Hypothyroidism  Hypersomnia with sleep apnea Asthma  Allergic rhinitis Anxiety  Tubular adenoma Irritable bowel syndrome  GERD (gastroesophageal reflux disease) Persistent hypersomnia  OSA (obstructive sleep apnea) Depression  Perioral dermatitis Complication of anesthesia==itching after second C section, is NOT a "complication of anesthesia." This C section was via epidural. The itching is caused by itching from medication delivered by the epidural route. Very likely from duramoprh administered for postop C section pain.  Anemia Wears contact lenses  Osteoarthritis Allergic rhinitis  Anemia  Anxiety  Asthma without status asthmaticus (HHS-HCC)  Depression (emotion)  GERD (gastroesophageal reflux disease)  Hypersomnia with sleep apnea, unspecified  IBS (irritable bowel syndrome)   Perioral dermatitis  takes doxycycline when she has a flare up  Sinusitis, unspecified  Tubular adenoma 04/16/2014  Unspecified hypothyroidism     Reproductive/Obstetrics negative OB ROS                             Anesthesia Physical Anesthesia Plan  ASA: 2  Anesthesia Plan: MAC   Post-op Pain Management:    Induction: Intravenous  PONV Risk Score and Plan:   Airway Management Planned: Natural Airway and Nasal Cannula  Additional Equipment:   Intra-op Plan:   Post-operative Plan:   Informed Consent: I have reviewed the patients History and Physical, chart, labs and discussed the procedure including the risks, benefits and alternatives for the proposed anesthesia with the patient or authorized representative who has indicated his/her understanding and acceptance.     Dental Advisory Given  Plan Discussed with: Anesthesiologist, CRNA and Surgeon  Anesthesia Plan Comments: (Patient consented for risks of anesthesia including but not limited to:  - adverse reactions to medications - damage to eyes, teeth, lips or other oral mucosa - nerve damage due to positioning  - sore throat or hoarseness - Damage to heart, brain, nerves, lungs, other parts of body or loss of life  Patient voiced understanding and assent.)        Anesthesia Quick Evaluation

## 2023-11-08 ENCOUNTER — Other Ambulatory Visit: Payer: Self-pay

## 2023-11-08 ENCOUNTER — Ambulatory Visit: Payer: 59 | Admitting: Anesthesiology

## 2023-11-08 ENCOUNTER — Encounter: Payer: Self-pay | Admitting: Gastroenterology

## 2023-11-08 ENCOUNTER — Ambulatory Visit
Admission: RE | Admit: 2023-11-08 | Discharge: 2023-11-08 | Disposition: A | Discharge status: Home / Self Care | Payer: 59 | Attending: Gastroenterology | Admitting: Gastroenterology

## 2023-11-08 ENCOUNTER — Encounter
Admission: RE | Disposition: A | Discharge status: Home / Self Care | Payer: Self-pay | Source: Home / Self Care | Attending: Gastroenterology

## 2023-11-08 DIAGNOSIS — D125 Benign neoplasm of sigmoid colon: Secondary | ICD-10-CM | POA: Insufficient documentation

## 2023-11-08 DIAGNOSIS — Z87891 Personal history of nicotine dependence: Secondary | ICD-10-CM | POA: Insufficient documentation

## 2023-11-08 DIAGNOSIS — R12 Heartburn: Secondary | ICD-10-CM | POA: Diagnosis not present

## 2023-11-08 DIAGNOSIS — D649 Anemia, unspecified: Secondary | ICD-10-CM | POA: Insufficient documentation

## 2023-11-08 DIAGNOSIS — Z7951 Long term (current) use of inhaled steroids: Secondary | ICD-10-CM | POA: Diagnosis not present

## 2023-11-08 DIAGNOSIS — E039 Hypothyroidism, unspecified: Secondary | ICD-10-CM | POA: Insufficient documentation

## 2023-11-08 DIAGNOSIS — Z79890 Hormone replacement therapy (postmenopausal): Secondary | ICD-10-CM | POA: Insufficient documentation

## 2023-11-08 DIAGNOSIS — F32A Depression, unspecified: Secondary | ICD-10-CM | POA: Insufficient documentation

## 2023-11-08 DIAGNOSIS — M1990 Unspecified osteoarthritis, unspecified site: Secondary | ICD-10-CM | POA: Insufficient documentation

## 2023-11-08 DIAGNOSIS — F419 Anxiety disorder, unspecified: Secondary | ICD-10-CM | POA: Insufficient documentation

## 2023-11-08 DIAGNOSIS — Z1211 Encounter for screening for malignant neoplasm of colon: Secondary | ICD-10-CM | POA: Diagnosis not present

## 2023-11-08 DIAGNOSIS — Z79899 Other long term (current) drug therapy: Secondary | ICD-10-CM | POA: Insufficient documentation

## 2023-11-08 DIAGNOSIS — K640 First degree hemorrhoids: Secondary | ICD-10-CM | POA: Diagnosis not present

## 2023-11-08 DIAGNOSIS — J45909 Unspecified asthma, uncomplicated: Secondary | ICD-10-CM | POA: Insufficient documentation

## 2023-11-08 DIAGNOSIS — K635 Polyp of colon: Secondary | ICD-10-CM | POA: Diagnosis not present

## 2023-11-08 DIAGNOSIS — K219 Gastro-esophageal reflux disease without esophagitis: Secondary | ICD-10-CM

## 2023-11-08 DIAGNOSIS — Z860100 Personal history of colon polyps, unspecified: Secondary | ICD-10-CM

## 2023-11-08 DIAGNOSIS — G4733 Obstructive sleep apnea (adult) (pediatric): Secondary | ICD-10-CM | POA: Insufficient documentation

## 2023-11-08 HISTORY — PX: COLONOSCOPY WITH PROPOFOL: SHX5780

## 2023-11-08 HISTORY — PX: POLYPECTOMY: SHX5525

## 2023-11-08 HISTORY — DX: Presence of spectacles and contact lenses: Z97.3

## 2023-11-08 HISTORY — DX: Other complications of anesthesia, initial encounter: T88.59XA

## 2023-11-08 HISTORY — PX: ESOPHAGOGASTRODUODENOSCOPY (EGD) WITH PROPOFOL: SHX5813

## 2023-11-08 HISTORY — DX: Anemia, unspecified: D64.9

## 2023-11-08 HISTORY — DX: Unspecified osteoarthritis, unspecified site: M19.90

## 2023-11-08 LAB — POCT PREGNANCY, URINE: Preg Test, Ur: NEGATIVE

## 2023-11-08 SURGERY — COLONOSCOPY WITH PROPOFOL
Anesthesia: Monitor Anesthesia Care | Site: Rectum

## 2023-11-08 MED ORDER — LIDOCAINE HCL (CARDIAC) PF 100 MG/5ML IV SOSY
PREFILLED_SYRINGE | INTRAVENOUS | Status: DC | PRN
  Administered 2023-11-08: 50 mg via INTRAVENOUS

## 2023-11-08 MED ORDER — LACTATED RINGERS IV SOLN: INTRAVENOUS | Status: DC | PRN

## 2023-11-08 MED ORDER — PROPOFOL 10 MG/ML IV BOLUS
INTRAVENOUS | Status: AC
  Filled 2023-11-08: qty 40

## 2023-11-08 MED ORDER — PROPOFOL 10 MG/ML IV BOLUS
INTRAVENOUS | Status: DC | PRN
  Administered 2023-11-08: 40 mg via INTRAVENOUS
  Administered 2023-11-08 (×2): 50 mg via INTRAVENOUS
  Administered 2023-11-08: 40 mg via INTRAVENOUS
  Administered 2023-11-08: 90 mg via INTRAVENOUS
  Administered 2023-11-08: 50 mg via INTRAVENOUS
  Administered 2023-11-08 (×2): 40 mg via INTRAVENOUS

## 2023-11-08 MED ORDER — SODIUM CHLORIDE 0.9 % IV SOLN: INTRAVENOUS | Status: DC

## 2023-11-08 MED ORDER — LIDOCAINE HCL (PF) 2 % IJ SOLN
INTRAMUSCULAR | Status: AC
  Filled 2023-11-08: qty 5

## 2023-11-08 MED ORDER — STERILE WATER FOR IRRIGATION IR SOLN
Status: DC | PRN
  Administered 2023-11-08: 1

## 2023-11-08 SURGICAL SUPPLY — 32 items
BALLN DILATOR 12-15 8 (BALLOONS)
BALLN DILATOR 15-18 8 (BALLOONS)
BALLN DILATOR CRE 0-12 8 (BALLOONS)
BALLN DILATOR ESOPH 8 10 CRE (MISCELLANEOUS) IMPLANT
BALLOON DILATOR 12-15 8 (BALLOONS) IMPLANT
BALLOON DILATOR 15-18 8 (BALLOONS) IMPLANT
BALLOON DILATOR CRE 0-12 8 (BALLOONS) IMPLANT
BLOCK BITE 60FR ADLT L/F GRN (MISCELLANEOUS) ×2 IMPLANT
CLIP HMST 235XBRD CATH ROT (MISCELLANEOUS) IMPLANT
ELECT REM PT RETURN 9FT ADLT (ELECTROSURGICAL)
ELECTRODE REM PT RTRN 9FT ADLT (ELECTROSURGICAL) IMPLANT
FCP ESCP3.2XJMB 240X2.8X (MISCELLANEOUS)
FORCEPS BIOP RAD 4 LRG CAP 4 (CUTTING FORCEPS) IMPLANT
FORCEPS ESCP3.2XJMB 240X2.8X (MISCELLANEOUS) IMPLANT
GOWN CVR UNV OPN BCK APRN NK (MISCELLANEOUS) ×4 IMPLANT
INJECTOR VARIJECT VIN23 (MISCELLANEOUS) IMPLANT
KIT DEFENDO VALVE AND CONN (KITS) IMPLANT
KIT PRC NS LF DISP ENDO (KITS) ×2 IMPLANT
MANIFOLD NEPTUNE II (INSTRUMENTS) ×2 IMPLANT
MARKER SPOT ENDO TATTOO 5ML (MISCELLANEOUS) IMPLANT
PROBE APC STR FIRE (PROBE) IMPLANT
RETRIEVER NET PLAT FOOD (MISCELLANEOUS) IMPLANT
RETRIEVER NET ROTH 2.5X230 LF (MISCELLANEOUS) IMPLANT
SNARE COLD EXACTO (MISCELLANEOUS) IMPLANT
SNARE SHORT THROW 13M SML OVAL (MISCELLANEOUS) IMPLANT
SNARE SHORT THROW 30M LRG OVAL (MISCELLANEOUS) IMPLANT
SNARE SNG USE RND 15MM (INSTRUMENTS) IMPLANT
SYR INFLATION 60ML (SYRINGE) IMPLANT
TRAP ETRAP POLY (MISCELLANEOUS) IMPLANT
VARIJECT INJECTOR VIN23 (MISCELLANEOUS)
WATER STERILE IRR 250ML POUR (IV SOLUTION) ×2 IMPLANT
WIRE CRE 18-20MM 8CM F G (MISCELLANEOUS) IMPLANT

## 2023-11-08 NOTE — Op Note (Addendum)
[facility] Patient Name: Heather Faulkner Procedure Date: 11/08/2023 8:03 AM MRN: [id number] Account #: [id number] Date of Birth: [birth year] Admit Type: Outpatient Age: 52 Room: [facility] OR ROOM 01 Gender: Female Note Status: Finalized Instrument Name: [id number] Procedure:             Upper GI endoscopy Indications:           Heartburn Providers:             Midge Minium MD, MD Referring MD:          Barbette Reichmann, MD (Referring MD) Medicines:             Propofol per Anesthesia Complications:         No immediate complications. Procedure:             Pre-Anesthesia Assessment:                        - Prior to the procedure, a History and Physical was                         performed, and patient medications and allergies were                         reviewed. The patient's tolerance of previous                         anesthesia was also reviewed. The risks and benefits                         of the procedure and the sedation options and risks                         were discussed with the patient. All questions were                         answered, and informed consent was obtained. Prior                         Anticoagulants: The patient has taken no anticoagulant                         or antiplatelet agents. ASA Grade Assessment: II - A                         patient with mild systemic disease. After reviewing                         the risks and benefits, the patient was deemed in                         satisfactory condition to undergo the procedure.                        After obtaining informed consent, the endoscope was                         passed under direct vision. Throughout the procedure,  the patient's blood pressure, pulse, and oxygen                         saturations were monitored continuously. The Endoscope                         was introduced through the mouth, and advanced to the                          second part of duodenum. The upper GI endoscopy was                         accomplished without difficulty. The patient tolerated                         the procedure well. Findings:      The Z-line was irregular and was found at the gastroesophageal junction.      The stomach was normal.      The examined duodenum was normal. Impression:            - Z-line irregular, at the gastroesophageal junction.                        - Normal stomach.                        - Normal examined duodenum.                        - No specimens collected. Recommendation:        - Discharge patient to home.                        - Resume previous diet.                        - Continue present medications.                        - Perform a colonoscopy today. Procedure Code(s):     --- Professional ---                        [phone], Esophagogastroduodenoscopy, flexible,                         transoral; diagnostic, including collection of                         specimen(s) by brushing or washing, when performed                         (separate procedure) Diagnosis Code(s):     --- Professional ---                        R12, Heartburn CPT copyright 2022 American Medical Association. All rights reserved. The codes documented in this report are preliminary and upon coder review may  be revised to meet current compliance requirements. Midge Minium MD, MD 11/08/2023 8:15:33 AM This report has been signed electronically. Number of Addenda: 0 Note Initiated On: 11/08/2023 8:03 AM Total  Procedure Duration: 0 hours 2 minutes 22 seconds  Estimated Blood Loss:  Estimated blood loss: none.      [facility]

## 2023-11-08 NOTE — Anesthesia Postprocedure Evaluation (Signed)
Anesthesia Post Note  Patient: Heather Faulkner  Procedure(s) Performed: COLONOSCOPY WITH BIOPSY (Rectum) ESOPHAGOGASTRODUODENOSCOPY (EGD) WITH PROPOFOL (Mouth) POLYPECTOMY (Rectum)  Patient location during evaluation: PACU Anesthesia Type: MAC Level of consciousness: awake and alert Pain management: pain level controlled Vital Signs Assessment: post-procedure vital signs reviewed and stable Respiratory status: spontaneous breathing, nonlabored ventilation, respiratory function stable and patient connected to nasal cannula oxygen Cardiovascular status: blood pressure returned to baseline and stable Postop Assessment: no apparent nausea or vomiting Anesthetic complications: no   No notable events documented.   Last Vitals:  Vitals:   11/08/23 0846 11/08/23 0850  BP: 112/73 125/88  Pulse: 64 65  Resp: 17 13  Temp:    SpO2: 100% 100%    Last Pain:  Vitals:   11/08/23 0840  TempSrc:   PainSc: 0-No pain                 Jemeka Wagler C Kervens Roper

## 2023-11-08 NOTE — Op Note (Signed)
[facility] Patient Name: Heather Faulkner Procedure Date: 11/08/2023 8:02 AM MRN: [id number] Account #: [id number] Date of Birth: 1971/03/13 Admit Type: Outpatient Age: 52 Room: [facility] OR ROOM 01 Gender: Female Note Status: Finalized Instrument Name: [id number] Procedure:             Colonoscopy Indications:           High risk colon cancer surveillance: Personal history                         of colonic polyps Providers:             Midge Minium MD, MD Referring MD:          Barbette Reichmann, MD (Referring MD) Medicines:             Propofol per Anesthesia Complications:         No immediate complications. Procedure:             Pre-Anesthesia Assessment:                        - Prior to the procedure, a History and Physical was                         performed, and patient medications and allergies were                         reviewed. The patient's tolerance of previous                         anesthesia was also reviewed. The risks and benefits                         of the procedure and the sedation options and risks                         were discussed with the patient. All questions were                         answered, and informed consent was obtained. Prior                         Anticoagulants: The patient has taken no anticoagulant                         or antiplatelet agents. ASA Grade Assessment: II - A                         patient with mild systemic disease. After reviewing                         the risks and benefits, the patient was deemed in                         satisfactory condition to undergo the procedure.                        After obtaining informed consent, the colonoscope was  passed under direct vision. Throughout the procedure,                         the patient's blood pressure, pulse, and oxygen                         saturations were monitored continuously. The                          Colonoscope was introduced through the anus and                         advanced to the the cecum, identified by appendiceal                         orifice and ileocecal valve. The colonoscopy was                         performed without difficulty. The patient tolerated                         the procedure well. The quality of the bowel                         preparation was excellent. Findings:      The perianal and digital rectal examinations were normal.      A 4 mm polyp was found in the sigmoid colon. The polyp was sessile. The       polyp was removed with a cold snare. Resection and retrieval were       complete.      Non-bleeding internal hemorrhoids were found during retroflexion. The       hemorrhoids were Grade I (internal hemorrhoids that do not prolapse). Impression:            - One 4 mm polyp in the sigmoid colon, removed with a                         cold snare. Resected and retrieved.                        - Non-bleeding internal hemorrhoids. Recommendation:        - Discharge patient to home.                        - Resume previous diet.                        - Continue present medications.                        - Await pathology results. Procedure Code(s):     --- Professional ---                        [phone], Colonoscopy, flexible; with removal of                         tumor(s), polyp(s), or other lesion(s) by snare  technique Diagnosis Code(s):     --- Professional ---                        Z86.010, Personal history of colonic polyps                        D12.5, Benign neoplasm of sigmoid colon CPT copyright 2022 American Medical Association. All rights reserved. The codes documented in this report are preliminary and upon coder review may  be revised to meet current compliance requirements. Midge Minium MD, MD 11/08/2023 8:30:21 AM This report has been signed electronically. Number of Addenda: 0 Note Initiated On:  11/08/2023 8:02 AM Scope Withdrawal Time: 0 hours 8 minutes 22 seconds  Total Procedure Duration: 0 hours 11 minutes 28 seconds  Estimated Blood Loss:  Estimated blood loss: none.      [facility]

## 2023-11-08 NOTE — Transfer of Care (Signed)
Immediate Anesthesia Transfer of Care Note  Patient: GENEAL FARANDA  Procedure(s) Performed: COLONOSCOPY WITH BIOPSY (Rectum) ESOPHAGOGASTRODUODENOSCOPY (EGD) WITH PROPOFOL (Mouth) POLYPECTOMY (Rectum)  Patient Location: PACU  Anesthesia Type: MAC  Level of Consciousness: awake, alert  and patient cooperative  Airway and Oxygen Therapy: Patient Spontanous Breathing and Patient connected to supplemental oxygen  Post-op Assessment: Post-op Vital signs reviewed, Patient's Cardiovascular Status Stable, Respiratory Function Stable, Patent Airway and No signs of Nausea or vomiting  Post-op Vital Signs: Reviewed and stable  Complications: No notable events documented.

## 2023-11-08 NOTE — H&P (Signed)
Midge Minium, MD [facility] [address], [address] [place], [place] [address] Phone:[phone] Fax : [phone]  Primary Care Physician:  Barbette Reichmann, MD Primary Gastroenterologist:  Dr. Servando Snare  Pre-Procedure History & Physical: HPI:  Heather Faulkner is a 52 y.o. female is here for an endoscopy and colonoscopy.   Past Medical History:  Diagnosis Date   Allergic rhinitis    Anemia    Anxiety    Asthma    Complication of anesthesia    Itching after epidural   Depression    GERD (gastroesophageal reflux disease)    Hypersomnia with sleep apnea    Hypothyroidism    Irritable bowel syndrome    OSA (obstructive sleep apnea)    Osteoarthritis    Perioral dermatitis    Persistent hypersomnia    Tubular adenoma    Wears contact lenses    sometimes    Past Surgical History:  Procedure Laterality Date   BREAST BIOPSY Left 10/01/2019   [place] BX, heart marker, PASH   BUNIONECTOMY Right    cesarean     x 2   CESAREAN SECTION     CHOLECYSTECTOMY     COLONOSCOPY     x 2   ESOPHAGOGASTRODUODENOSCOPY     nasal surgery (polyps)     x2    Prior to Admission medications   Medication Sig Start Date End Date Taking? Authorizing Provider  albuterol (VENTOLIN HFA) 108 (90 BASE) MCG/ACT inhaler Inhale into the lungs every 6 (six) hours as needed.    Yes [provider]  amphetamine-dextroamphetamine (ADDERALL) 5 MG tablet Take 5 mg by mouth daily.   Yes [provider]  busPIRone (BUSPAR) 5 MG tablet Take 5 mg by mouth as needed.   Yes [provider]  cyclobenzaprine (FLEXERIL) 5 MG tablet TAKE 1 TABLET (5 MG TOTAL) 3 (THREE) TIMES DAILY AS NEEDED FOR MUSCLE SPASMS FOR UP TO 7 DAYS 09/30/23  Yes [provider]  ergocalciferol (VITAMIN D2) 1.25 MG (50000 UT) capsule Take 1 capsule by mouth once a week. 11/14/21  Yes [provider]  furosemide (LASIX) 20 MG tablet Take by mouth daily as needed. 10/08/23 11/07/23 Yes [provider]   iron polysaccharides (NIFEREX) 150 MG capsule TAKE 1 CAPSULE (150 MG TOTAL) BY MOUTH ONCE DAILY.   Yes [provider]  lamoTRIgine (LAMICTAL) 200 MG tablet Take 200 mg by mouth daily. 01/31/18  Yes [provider]  levothyroxine (SYNTHROID) 125 MCG tablet Take by mouth. Monday - Friday 05/14/23 11/10/23 Yes [provider]  levothyroxine (SYNTHROID) 150 MCG tablet Take by mouth. Saturday and Sunday 05/18/23 11/14/23 Yes [provider]  montelukast (SINGULAIR) 10 MG tablet TAKE ONE TABLET BY MOUTH DAILY 08/17/14  Yes [provider]  Probiotic Product (PROBIOTIC ADVANCED PO) Take by mouth daily.   Yes [provider]  traZODone (DESYREL) 50 MG tablet Take 50-75 mg by mouth at bedtime as needed. 09/11/22  Yes [provider]  Dwyane Luo 200-62.5-25 MCG/ACT AEPB INHALE 1 PUFF INTO THE LUNGS ONCE DAILY.   Yes [provider]  ADVAIR HFA 115-21 MCG/ACT inhaler INHALE 2 INHALATIONS INTO THE LUNGS EVERY 12 (TWELVE) HOURS. Patient not taking: Reported on 11/04/2023 11/22/17   [provider]  esomeprazole (NEXIUM) 40 MG capsule Take 1 capsule (40 mg total) by mouth 2 (two) times daily before a meal. Patient not taking: Reported on 11/04/2023 10/10/23   Midge Minium, MD    Allergies as of 10/10/2023 - Review  Complete 10/10/2023  Allergen Reaction Noted   Ketoconazole Other (See Comments) 04/18/2018    Family History  Problem Relation Age of Onset   Hypertension Mother    Hyperlipidemia Mother    Polymyalgia rheumatica Mother    Hypertension Father    Breast cancer Other    Diabetes Maternal Grandfather    Heart attack Maternal Grandfather    Stroke Paternal Grandmother    Celiac disease Daughter    Breast cancer Cousin     Social History   Socioeconomic History   Marital status: Divorced    Spouse name: Not on file   Number of children: 2   Years of education: Not on file   Highest education level: Not  on file  Occupational History   Not on file  Tobacco Use   Smoking status: Former    Types: Cigarettes   Smokeless tobacco: Never   Tobacco comments:    social in college  Vaping Use   Vaping status: Never Used  Substance and Sexual Activity   Alcohol use: Yes    Comment: Rare   Drug use: No   Sexual activity: Not on file  Other Topics Concern   Not on file  Social History Narrative   Right handed, FT - Licensed clinical SW, (on [employer]), Masters SW, Caffeine 2-3 cups coffee, Divorced, 2 kids   Social Drivers of Corporate investment banker Strain: Low Risk  (09/18/2023)   Received from [facility]   Overall Financial Resource Strain (CARDIA)    Difficulty of Paying Living Expenses: Not hard at all  Food Insecurity: No Food Insecurity (09/18/2023)   [facility]   Hunger Vital Sign    Worried About Running Out of Food in the Last Year: Never true    Ran Out of Food in the Last Year: Never true  Transportation Needs: No Transportation Needs (09/18/2023)   Received from [facility] - Transportation    In the past 12 months, has lack of transportation kept you from medical appointments or from getting medications?: No    Lack of Transportation (Non-Medical): No  Physical Activity: Not on file  Stress: Not on file  Social Connections: Not on file  Intimate Partner Violence: Not on file    Review of Systems: See HPI, otherwise negative ROS  Physical Exam: BP 131/68   Pulse 76   Temp 98.6 F (37 C) (Temporal)   Resp 12   Ht 5\' 2"  (1.575 m)   Wt 75.3 kg   LMP 01/25/2023 (Approximate)   SpO2 96%   BMI 30.36 kg/m  General:   Alert,  pleasant and cooperative in NAD Head:  Normocephalic and atraumatic. Neck:  Supple; no masses or thyromegaly. Lungs:  Clear throughout to auscultation.    Heart:  Regular rate and rhythm. Abdomen:  Soft, nontender and nondistended. Normal bowel sounds, without  guarding, and without rebound.   Neurologic:  Alert and  oriented x4;  grossly normal neurologically.  Impression/Plan: Heather Faulkner is here for an endoscopy and colonoscopy to be performed for GERD and a history of adenomatous polyps on 2019   Risks, benefits, limitations, and alternatives regarding  endoscopy and colonoscopy have been reviewed with the patient.  Questions have been answered.  All parties agreeable.   Midge Minium, MD  11/08/2023, 7:45 AM

## 2023-11-09 ENCOUNTER — Encounter: Payer: Self-pay | Admitting: Gastroenterology

## 2023-11-11 LAB — SURGICAL PATHOLOGY

## 2023-11-12 ENCOUNTER — Encounter: Payer: Self-pay | Admitting: Gastroenterology

## 2023-11-24 DIAGNOSIS — G4733 Obstructive sleep apnea (adult) (pediatric): Secondary | ICD-10-CM | POA: Diagnosis not present

## 2023-12-25 DIAGNOSIS — G4733 Obstructive sleep apnea (adult) (pediatric): Secondary | ICD-10-CM | POA: Diagnosis not present

## 2024-01-09 ENCOUNTER — Encounter (INDEPENDENT_AMBULATORY_CARE_PROVIDER_SITE_OTHER): Payer: Self-pay

## 2024-01-24 DIAGNOSIS — F411 Generalized anxiety disorder: Secondary | ICD-10-CM | POA: Diagnosis not present

## 2024-01-27 DIAGNOSIS — M47812 Spondylosis without myelopathy or radiculopathy, cervical region: Secondary | ICD-10-CM | POA: Diagnosis not present

## 2024-01-30 DIAGNOSIS — F411 Generalized anxiety disorder: Secondary | ICD-10-CM | POA: Diagnosis not present

## 2024-02-11 ENCOUNTER — Ambulatory Visit: Admitting: Neurosurgery

## 2024-02-11 ENCOUNTER — Inpatient Hospital Stay
Admission: RE | Admit: 2024-02-11 | Discharge: 2024-02-11 | Disposition: A | Discharge status: Home / Self Care | Payer: Self-pay | Source: Ambulatory Visit | Attending: Neurosurgery | Admitting: Neurosurgery

## 2024-02-11 ENCOUNTER — Encounter: Payer: Self-pay | Admitting: Neurosurgery

## 2024-02-11 ENCOUNTER — Other Ambulatory Visit: Payer: Self-pay | Admitting: Family Medicine

## 2024-02-11 VITALS — BP 124/80 | Ht 62.0 in | Wt 169.8 lb

## 2024-02-11 DIAGNOSIS — M50322 Other cervical disc degeneration at C5-C6 level: Secondary | ICD-10-CM | POA: Diagnosis not present

## 2024-02-11 DIAGNOSIS — M5412 Radiculopathy, cervical region: Secondary | ICD-10-CM

## 2024-02-11 DIAGNOSIS — Z049 Encounter for examination and observation for unspecified reason: Secondary | ICD-10-CM

## 2024-02-11 DIAGNOSIS — M50321 Other cervical disc degeneration at C4-C5 level: Secondary | ICD-10-CM

## 2024-02-11 DIAGNOSIS — M5010 Cervical disc disorder with radiculopathy, unspecified cervical region: Secondary | ICD-10-CM

## 2024-02-11 MED ORDER — CYCLOBENZAPRINE HCL 10 MG PO TABS: 10.0000 mg | ORAL_TABLET | Freq: Three times a day (TID) | ORAL | 0 refills | Status: AC | PRN

## 2024-02-11 NOTE — Progress Notes (Signed)
 Progress Note: Referring Physician:  No referring provider defined for this encounter.  Primary Physician:  Heather Reichmann, MD  Chief Complaint:  ongoing cervical complaints  History of Present Illness: Heather Faulkner is a 53 y.o. female who presents with the chief complaint of ongoing cervical complaints.  She was seen by Heather Faulkner in 2023 who recommended physical therapy. Her symptoms resolved until a recurrence a couple of weeks ago after the loss of her father with precipitated travel.  Today she reports several weeks of recurrent neck pain that feels like a squeezing pressure with associated pain down her right arm and numbness and tingling into all of her fingertips. She was seen at [facility] and has undergone 1 session of PT this year.  She has also completed a steroid taper as well as Flexeril and over-the-counter medications with minimal relief.  Despite this she feels as though her symptoms may have improved some over the last couple of weeks.  She does endorse some subjective weakness particularly with grasping objects and lifting.  She denies any left-sided symptoms.  09/25/2022 Heather Faulkner Heather Faulkner is here today with a chief complaint of tightness in her neck, numbness into her right arm, and discomfort into her hand.  This began approximate 2 years ago and has worsened over the past several months.  She reports tightness, dullness, stiffness, aching, and burning that is made worse by lifting and typing.  Ice and rest have helped.  Ibuprofen has helped.  Her neck pain is constant, while her arm pain is intermittent.     Bowel/Bladder Dysfunction: none   Conservative measures: ice, heat Physical therapy:  has not participated Multimodal medical therapy including regular antiinflammatories: flexeril, gabapentin, mobic, prednisone, skelaxin, ibuprofen Injections:  has received epidural steroid injections 08/03/22: C7-T1 IL ESI at [facility]   Past  Surgery:  denies   Heather Faulkner has no symptoms of cervical myelopathy.   The symptoms are causing a significant impact on the patient's life.   Exam: There were no vitals filed for this visit. There is no height or weight on file to calculate BMI.    NEUROLOGICAL:  General: In no acute distress.   Awake, alert, oriented to person, place, and time.  Pupils equal round and reactive to light.  Facial tone is symmetric.    Strength: Side Biceps Triceps Deltoid Interossei Grip Wrist Ext. Wrist Flex.  R 5 4+ 5 5 5 5 5   L 5 5 5 5 5 5 5      Imaging: 07/17/22  FINDINGS: Alignment: Mild right convex curvature of the cervical spine and reversal of the normal cervical lordosis. Trace retrolisthesis of C4 on C5 and C5 on C6.   Vertebrae: No fracture, suspicious marrow lesion, or significant marrow edema. Moderate disc space narrowing and chronic degenerative endplate changes at C4-5 and C5-6.   Cord: Normal signal and morphology.   Posterior Fossa, vertebral arteries, paraspinal tissues: Unremarkable.   Disc levels:   C2-3: Negative.   C3-4: Small central disc protrusion without spinal cord mass effect or significant spinal stenosis. Borderline to mild right neural foraminal stenosis due to uncovertebral spurring.   C4-5: Broad-based posterior disc osteophyte complex results in borderline spinal stenosis and moderate to severe left greater than right neural foraminal stenosis with potential bilateral C5 nerve root impingement.   C5-6: Broad-based posterior disc osteophyte complex results in borderline spinal stenosis and moderate right and severe left neural foraminal stenosis with potential bilateral C6 nerve root  impingement.   C6-7: Mild disc bulging and a small right foraminal disc protrusion (series 11, image 25) result in moderate right neural foraminal stenosis with potential right C7 nerve root impingement. Patent spinal canal and left neural foramen.    C7-T1: Mild left facet arthrosis without disc herniation or stenosis.   IMPRESSION: 1. Multilevel disc degeneration, most advanced at C4-5 and C5-6 where there is moderate to severe neural foraminal stenosis. 2. Moderate right neural foraminal stenosis at C6-7 due to a disc protrusion.     Electronically Signed   By: Heather Faulkner M.D.   On: 07/18/2022 13:07    I have personally reviewed the images and agree with the above interpretation.  Assessment and Plan: Heather Faulkner is a pleasant 53 y.o. female with acute on chronic cervical radiculopathy.  We discussed her symptoms at length and they seem very similar to the symptoms she had in 2023.  I recommended that she continue with physical therapy until she is discharged.  I also recommended that she continue with medications as needed and have given her a refill of her Flexeril.  We briefly discussed starting medication like gabapentin however she is not had a good reaction to this in the past.  We also discussed potentially updating a cervical MRI however she did not have a good response to cervical injections and is not interested in repeating them at this time.  She does not also want to consider surgery just yet.  We will reserve repeating an MRI should her symptoms worsen or should she decide to move forward with invasive intervention.  We briefly discussed the assessment and plan documented by Heather Faulkner in 2023 which recommended completing physical therapy and considering C4-7 ACDF if she failed to improve with conservative treatment.  We briefly discussed the risks and benefits of this procedure as well as postoperative expectations however I did caution that she would need an updated MRI and that findings on updated imaging could potentially change surgical recommendations.  She will continue with PT at [facility] for now and I will see her back in 4-6 weeks via telephone visit to discuss her response and next steps.  She was encouraged  to call the office in the interim should she have any questions or concerns or should her symptoms change or worsen.  I spent a total of 40 minutes in both face-to-face and non-face-to-face activities for this visit on the date of this encounter including extensive review of outside imaging, review of outside documentation, symptoms, physical exam, discussion of diagnosis, discussion of risks and benefits of potential surgical intervention, and order placement.  Manning Charity PA-C Neurosurgery

## 2024-02-13 DIAGNOSIS — F411 Generalized anxiety disorder: Secondary | ICD-10-CM | POA: Diagnosis not present

## 2024-02-13 DIAGNOSIS — F3342 Major depressive disorder, recurrent, in full remission: Secondary | ICD-10-CM | POA: Diagnosis not present

## 2024-02-13 DIAGNOSIS — F900 Attention-deficit hyperactivity disorder, predominantly inattentive type: Secondary | ICD-10-CM | POA: Diagnosis not present

## 2024-02-13 DIAGNOSIS — F5101 Primary insomnia: Secondary | ICD-10-CM | POA: Diagnosis not present

## 2024-02-21 DIAGNOSIS — F411 Generalized anxiety disorder: Secondary | ICD-10-CM | POA: Diagnosis not present

## 2024-03-12 ENCOUNTER — Ambulatory Visit (INDEPENDENT_AMBULATORY_CARE_PROVIDER_SITE_OTHER): Admitting: Neurosurgery

## 2024-03-12 DIAGNOSIS — M5412 Radiculopathy, cervical region: Secondary | ICD-10-CM | POA: Diagnosis not present

## 2024-03-12 DIAGNOSIS — M5010 Cervical disc disorder with radiculopathy, unspecified cervical region: Secondary | ICD-10-CM

## 2024-03-12 NOTE — Progress Notes (Signed)
 Neurosurgery Telephone (Audio-Only) Note  Requesting Provider     Heather Reichmann, MD [address] [place],  [place] [address] T: [phone] F: [phone]  Primary Care Provider Heather Reichmann, MD [address] [place] [address] T: [phone] F: [phone]  Telehealth visit was conducted with Heather Faulkner, a 53 y.o. female via telephone.  History of Present Illness: Heather Faulkner a 53 y.o presenting for phone follow up after initiation PT. She has had 2-3 sessions and feels like things are unimproved. She was instructed to do traction at home and feels like this is making things worse. She has another session tomorrow.   02/11/24 Heather Faulkner is a 53 y.o. female who presents with the chief complaint of ongoing cervical complaints.  She was seen by Heather Faulkner in 2023 who recommended physical therapy. Her symptoms resolved until a recurrence a couple of weeks ago after the loss of her father with precipitated travel.  Today she reports several weeks of recurrent neck pain that feels like a squeezing pressure with associated pain down her right arm and numbness and tingling into all of her fingertips. She was seen at [facility] and has undergone 1 session of PT this year.  She has also completed a steroid taper as well as Flexeril and over-the-counter medications with minimal relief.  Despite this she feels as though her symptoms may have improved some over the last couple of weeks.  She does endorse some subjective weakness particularly with grasping objects and lifting.  She denies any left-sided symptoms.   09/25/2022 Heather Faulkner Heather Faulkner is here today with a chief complaint of tightness in her neck, numbness into her right arm, and discomfort into her hand.  This began approximate 2 years ago and has worsened over the past several months.  She reports tightness, dullness, stiffness, aching, and  burning that is made worse by lifting and typing.  Ice and rest have helped.  Ibuprofen has helped.  Her neck pain is constant, while her arm pain is intermittent.     Bowel/Bladder Dysfunction: none   Conservative measures: ice, heat Physical therapy:  has not participated Multimodal medical therapy including regular antiinflammatories: flexeril, gabapentin, mobic, prednisone, skelaxin, ibuprofen Injections:  has received epidural steroid injections 08/03/22: C7-T1 IL ESI at [facility]   Past Surgery:  denies   Heather Faulkner has no symptoms of cervical myelopathy.   The symptoms are causing a significant impact on the patient's life.   General Review of Systems:  A ROS was performed including pertinent positive and negatives as documented.  All other systems are negative.   Prior to Admission medications   Medication Sig Start Date End Date Taking? Authorizing Provider  albuterol (VENTOLIN HFA) 108 (90 BASE) MCG/ACT inhaler Inhale into the lungs every 6 (six) hours as needed.     [provider]  busPIRone (BUSPAR) 5 MG tablet Take 5 mg by mouth as needed.    [provider]  celecoxib (CELEBREX) 100 MG capsule Take 1 capsule by mouth 2 (two) times daily. 10/22/23   [provider]  cyclobenzaprine (FLEXERIL) 10 MG tablet Take 1 tablet (10 mg total) by mouth 3 (three) times daily as needed for muscle spasms. 02/11/24   Heather Borders, PA  dextroamphetamine (DEXEDRINE SPANSULE) 5 MG 24 hr capsule Take 5 mg by mouth every morning. 01/01/24   [provider]  ergocalciferol (VITAMIN D2) 1.25 MG (50000 UT) capsule  Take 1 capsule by mouth once a week. 11/14/21   [provider]  furosemide (LASIX) 20 MG tablet Take by mouth daily as needed. 10/08/23 02/11/24  [provider]  iron polysaccharides (NIFEREX) 150 MG capsule TAKE 1 CAPSULE (150 MG TOTAL) BY MOUTH ONCE DAILY.    [provider]  lamoTRIgine (LAMICTAL) 200 MG  tablet Take 200 mg by mouth daily. 01/31/18   [provider]  levothyroxine (SYNTHROID) 125 MCG tablet Take 125 mcg by mouth daily.    [provider]  montelukast (SINGULAIR) 10 MG tablet TAKE ONE TABLET BY MOUTH DAILY 08/17/14   [provider]  Probiotic Product (PROBIOTIC ADVANCED PO) Take by mouth daily.    [provider]  traZODone (DESYREL) 50 MG tablet Take 50-75 mg by mouth at bedtime as needed. 09/11/22   [provider]  Heather Faulkner 200-62.5-25 MCG/ACT AEPB INHALE 1 PUFF INTO THE LUNGS ONCE DAILY.    [provider]   DATA REVIEWED   IMPRESSION  Heather Faulkner is a 53 y.o. female who I performed a telephone encounter today for evaluation and management of cervical radiculopathy   PLAN  Heather Faulkner is a pleasant 53 y.o presenting today after PT. unfortunately this has not improved her symptoms and she continues to have significant right arm and neck pain with associated numbness and tingling.  She would like to move forward with a cervical MRI.  I have placed an order for this.  I will see her back once that is completed to review the results and discuss further plan of care.  She expressed understanding and was in agreement with this plan.  No orders of the defined types were placed in this encounter.   DISPOSITION  Follow up: In person appointment in  after completion of cervical MRI  Heather Bateman, PA Neurosurgery   TELEPHONE DOCUMENTATION   This visit was performed via telephone.  Patient location: home Provider location: office  I spent a total of 5 minutes non-face-to-face activities for this visit on the date of this encounter including review of current clinical condition and response to treatment.  The patient is aware of and accepts the limits of this telehealth visit.

## 2024-03-13 DIAGNOSIS — M5412 Radiculopathy, cervical region: Secondary | ICD-10-CM | POA: Diagnosis not present

## 2024-03-13 DIAGNOSIS — M542 Cervicalgia: Secondary | ICD-10-CM | POA: Diagnosis not present

## 2024-03-19 DIAGNOSIS — R140 Abdominal distension (gaseous): Secondary | ICD-10-CM | POA: Diagnosis not present

## 2024-03-19 DIAGNOSIS — R490 Dysphonia: Secondary | ICD-10-CM | POA: Diagnosis not present

## 2024-03-19 DIAGNOSIS — E66811 Obesity, class 1: Secondary | ICD-10-CM | POA: Diagnosis not present

## 2024-03-19 DIAGNOSIS — M25541 Pain in joints of right hand: Secondary | ICD-10-CM | POA: Diagnosis not present

## 2024-03-19 DIAGNOSIS — R053 Chronic cough: Secondary | ICD-10-CM | POA: Diagnosis not present

## 2024-03-19 DIAGNOSIS — M5412 Radiculopathy, cervical region: Secondary | ICD-10-CM | POA: Diagnosis not present

## 2024-03-19 DIAGNOSIS — G4733 Obstructive sleep apnea (adult) (pediatric): Secondary | ICD-10-CM | POA: Diagnosis not present

## 2024-03-19 DIAGNOSIS — M25542 Pain in joints of left hand: Secondary | ICD-10-CM | POA: Diagnosis not present

## 2024-03-19 DIAGNOSIS — E039 Hypothyroidism, unspecified: Secondary | ICD-10-CM | POA: Diagnosis not present

## 2024-03-23 DIAGNOSIS — M25542 Pain in joints of left hand: Secondary | ICD-10-CM | POA: Diagnosis not present

## 2024-03-23 DIAGNOSIS — G4733 Obstructive sleep apnea (adult) (pediatric): Secondary | ICD-10-CM | POA: Diagnosis not present

## 2024-03-23 DIAGNOSIS — E66811 Obesity, class 1: Secondary | ICD-10-CM | POA: Diagnosis not present

## 2024-03-23 DIAGNOSIS — Z6834 Body mass index (BMI) 34.0-34.9, adult: Secondary | ICD-10-CM | POA: Diagnosis not present

## 2024-03-23 DIAGNOSIS — R140 Abdominal distension (gaseous): Secondary | ICD-10-CM | POA: Diagnosis not present

## 2024-03-23 DIAGNOSIS — R053 Chronic cough: Secondary | ICD-10-CM | POA: Diagnosis not present

## 2024-03-23 DIAGNOSIS — R490 Dysphonia: Secondary | ICD-10-CM | POA: Diagnosis not present

## 2024-03-23 DIAGNOSIS — M5412 Radiculopathy, cervical region: Secondary | ICD-10-CM | POA: Diagnosis not present

## 2024-03-23 DIAGNOSIS — E039 Hypothyroidism, unspecified: Secondary | ICD-10-CM | POA: Diagnosis not present

## 2024-03-23 DIAGNOSIS — M25541 Pain in joints of right hand: Secondary | ICD-10-CM | POA: Diagnosis not present

## 2024-03-24 NOTE — Telephone Encounter (Signed)
 Patient calling back that she went to PT at [facility] about 2 months ago. She will have them fax over those notes. She stopped going to PT because it was too expensive for her. She could not afford to pay $85 a visit. The physical therapist gave her exercises to do at home. Once we get the notes can we submit those to insurance?

## 2024-04-22 DIAGNOSIS — R768 Other specified abnormal immunological findings in serum: Secondary | ICD-10-CM | POA: Diagnosis not present

## 2024-04-22 DIAGNOSIS — M791 Myalgia, unspecified site: Secondary | ICD-10-CM | POA: Diagnosis not present

## 2024-04-22 DIAGNOSIS — M7062 Trochanteric bursitis, left hip: Secondary | ICD-10-CM | POA: Diagnosis not present

## 2024-04-30 NOTE — Telephone Encounter (Signed)
 Notes have been scanned into patient's chart.

## 2024-05-23 DIAGNOSIS — R070 Pain in throat: Secondary | ICD-10-CM | POA: Diagnosis not present

## 2024-05-23 DIAGNOSIS — Z20822 Contact with and (suspected) exposure to covid-19: Secondary | ICD-10-CM | POA: Diagnosis not present

## 2024-05-23 DIAGNOSIS — J029 Acute pharyngitis, unspecified: Secondary | ICD-10-CM | POA: Diagnosis not present

## 2024-06-15 DIAGNOSIS — G4733 Obstructive sleep apnea (adult) (pediatric): Secondary | ICD-10-CM | POA: Diagnosis not present

## 2024-06-15 DIAGNOSIS — J302 Other seasonal allergic rhinitis: Secondary | ICD-10-CM | POA: Diagnosis not present

## 2024-06-15 DIAGNOSIS — Z7952 Long term (current) use of systemic steroids: Secondary | ICD-10-CM | POA: Diagnosis not present

## 2024-06-15 DIAGNOSIS — K219 Gastro-esophageal reflux disease without esophagitis: Secondary | ICD-10-CM | POA: Diagnosis not present

## 2024-06-15 DIAGNOSIS — R053 Chronic cough: Secondary | ICD-10-CM | POA: Diagnosis not present

## 2024-06-15 DIAGNOSIS — Z6832 Body mass index (BMI) 32.0-32.9, adult: Secondary | ICD-10-CM | POA: Diagnosis not present

## 2024-06-15 DIAGNOSIS — E66811 Obesity, class 1: Secondary | ICD-10-CM | POA: Diagnosis not present

## 2024-06-29 NOTE — Addendum Note (Signed)
 Addended by: GIRARD DON GAILS on: 06/29/2024 02:21 PM   Modules accepted: Orders

## 2024-07-06 DIAGNOSIS — J4540 Moderate persistent asthma, uncomplicated: Secondary | ICD-10-CM | POA: Diagnosis not present

## 2024-07-20 DIAGNOSIS — F411 Generalized anxiety disorder: Secondary | ICD-10-CM | POA: Diagnosis not present

## 2024-07-28 ENCOUNTER — Encounter (INDEPENDENT_AMBULATORY_CARE_PROVIDER_SITE_OTHER): Payer: Self-pay | Admitting: Adult Health

## 2024-07-28 ENCOUNTER — Ambulatory Visit (INDEPENDENT_AMBULATORY_CARE_PROVIDER_SITE_OTHER): Admitting: Adult Health

## 2024-07-28 VITALS — BP 123/83 | HR 65 | Temp 98.4°F | Ht 62.0 in | Wt 169.0 lb

## 2024-07-28 DIAGNOSIS — Z0289 Encounter for other administrative examinations: Secondary | ICD-10-CM

## 2024-07-28 DIAGNOSIS — E669 Obesity, unspecified: Secondary | ICD-10-CM | POA: Diagnosis not present

## 2024-07-28 DIAGNOSIS — G4710 Hypersomnia, unspecified: Secondary | ICD-10-CM

## 2024-07-28 DIAGNOSIS — J4520 Mild intermittent asthma, uncomplicated: Secondary | ICD-10-CM

## 2024-07-28 DIAGNOSIS — K219 Gastro-esophageal reflux disease without esophagitis: Secondary | ICD-10-CM

## 2024-07-28 DIAGNOSIS — Z6830 Body mass index (BMI) 30.0-30.9, adult: Secondary | ICD-10-CM | POA: Diagnosis not present

## 2024-07-28 DIAGNOSIS — G4719 Other hypersomnia: Secondary | ICD-10-CM

## 2024-07-28 DIAGNOSIS — R41840 Attention and concentration deficit: Secondary | ICD-10-CM | POA: Diagnosis not present

## 2024-07-28 NOTE — Progress Notes (Signed)
 Office: [phone]  /  Fax: [phone]   Initial Visit    Heather Faulkner was seen in clinic today to evaluate for obesity. She is interested in losing weight to improve overall health and reduce the risk of weight related complications. She presents today to review program treatment options, initial physical assessment, and evaluation.     She was referred by: Self-Referral  When asked what else they would like to accomplish? She states: Adopt a healthier eating pattern and lifestyle, Improve energy levels and physical activity, Improve existing medical conditions, Reduce number of medications, Improve quality of life, and Safely stop nightly CPAP.  When asked how has your weight affected you? She states: Contributed to medical problems, Contributed to orthopedic problems or mobility issues, Having fatigue, Having poor endurance, Problems with eating patterns, and Has affected mood   Weight history: She reports gaining weight in college, with both of her pregnancies, and peri menopause.   Highest weight: 184 lbs  Some associated conditions: OSA and Vitamin D Deficiency  Contributing factors: disruption of circadian rhythm / sleep disordered breathing, consumption of processed foods, use of obesogenic medications: Psychotropic medications and Antiepileptics, moderate to high levels of stress, reduced physical activity, menopause, sedentary job, and need for convenient foods  Weight promoting medications identified: Psychotropic medications and Antiepileptics  Prior weight loss attempts: Weight Watchers and Balanced Plate / Portion Control  Current nutrition plan: None  Current level of physical activity: None  Current or previous pharmacotherapy: Phentermine and Other: Qysmia   Response to medication: Had side effects so it was discontinued   Past medical history includes:   Past Medical History:  Diagnosis Date   Allergic rhinitis    Anemia    Anxiety    Asthma     Complication of anesthesia    Itching after epidural   Depression    GERD (gastroesophageal reflux disease)    Hypersomnia with sleep apnea    Hypothyroidism    Irritable bowel syndrome    OSA (obstructive sleep apnea)    Osteoarthritis    Perioral dermatitis    Persistent hypersomnia    Tubular adenoma    Wears contact lenses    sometimes     Objective    BP 123/83   Pulse 65   Temp 98.4 F (36.9 C)   Ht 5' 2 (1.575 m)   Wt 169 lb (76.7 kg)   SpO2 98%   BMI 30.91 kg/m  She was weighed on the bioimpedance scale: Body mass index is 30.91 kg/m.  Body Fat%:40.2, Visceral Fat Rating:10, Weight trend over the last 12 months: Unchanged  General:  Alert, oriented and cooperative. Patient is in no acute distress.  Respiratory: Normal respiratory effort, no problems with respiration noted   Gait: able to ambulate independently  Mental Status: Normal mood and affect. Normal behavior. Normal judgment and thought content.   DIAGNOSTIC DATA REVIEWED:  BMET    Component Value Date/Time   NA 135 06/03/2021 0412   NA 141 10/01/2012 0919   K 3.5 06/03/2021 0412   K 4.0 10/01/2012 0919   CL 105 06/03/2021 0412   CL 108 (H) 10/01/2012 0919   CO2 23 06/03/2021 0412   CO2 25 10/01/2012 0919   GLUCOSE 135 (H) 06/03/2021 0412   GLUCOSE 77 10/01/2012 0919   BUN 10 06/03/2021 0412   BUN 12 10/01/2012 0919   CREATININE 0.78 06/03/2021 0412   CREATININE 0.74 10/01/2012 0919   CALCIUM 8.1 (L) 06/03/2021 9587  CALCIUM 8.2 (L) 10/01/2012 0919   GFRNONAA >60 06/03/2021 0412   GFRNONAA >60 10/01/2012 0919   GFRAA >60 06/16/2019 1313   GFRAA >60 10/01/2012 0919   Lab Results  Component Value Date   HGBA1C 5.1 06/07/2015   No results found for: INSULIN CBC    Component Value Date/Time   WBC 7.1 06/03/2021 0412   RBC 3.92 06/03/2021 0412   HGB 12.4 06/03/2021 0412   HGB 12.7 10/01/2012 0919   HCT 37.3 06/03/2021 0412   HCT 36.9 10/01/2012 0919   PLT 342 06/03/2021 0412    PLT 284 10/01/2012 0919   MCV 95.2 06/03/2021 0412   MCV 97 10/01/2012 0919   MCH 31.6 06/03/2021 0412   MCHC 33.2 06/03/2021 0412   RDW 12.7 06/03/2021 0412   RDW 13.0 10/01/2012 0919   Iron/TIBC/Ferritin/ %Sat No results found for: IRON, TIBC, FERRITIN, IRONPCTSAT Lipid Panel     Component Value Date/Time   CHOL 168 10/01/2012 0919   TRIG 92 10/01/2012 0919   HDL 57 10/01/2012 0919   VLDL 18 10/01/2012 0919   LDLCALC 93 10/01/2012 0919   Hepatic Function Panel     Component Value Date/Time   PROT 7.2 06/03/2021 0412   PROT 7.1 10/01/2012 0919   ALBUMIN 4.3 06/03/2021 0412   ALBUMIN 3.7 10/01/2012 0919   AST 21 06/03/2021 0412   AST 19 10/01/2012 0919   ALT 14 06/03/2021 0412   ALT 19 10/01/2012 0919   ALKPHOS 76 06/03/2021 0412   ALKPHOS 91 10/01/2012 0919   BILITOT 0.4 06/03/2021 0412   BILITOT 0.5 10/01/2012 0919      Component Value Date/Time   TSH 3.960 06/07/2015 0845     Assessment and Plan   Mild intermittent asthma, unspecified whether complicated  Gastroesophageal reflux disease, unspecified whether esophagitis present  Persistent hypersomnia  Attention deficit  Obesity (BMI 30-39.9), Starting BMI 31.1    Assessent and Plan          ESTABLISH WITH [facility]     Obesity Treatment / Action Plan:  Patient will work on garnering support from family and friends to begin weight loss journey. Will work on eliminating or reducing the presence of highly palatable, calorie dense foods in the home. Will complete provided nutritional and psychosocial assessment questionnaire before the next appointment. Will be scheduled for indirect calorimetry to determine resting energy expenditure in a fasting state.  This will allow us  to create a reduced calorie, high-protein meal plan to promote loss of fat mass while preserving muscle mass. Counseled on the health benefits of losing 5%-15% of total body weight. Was counseled on nutritional approaches to  weight loss and benefits of reducing processed foods and consuming plant-based foods and high quality protein as part of nutritional weight management. Was counseled on pharmacotherapy and role as an adjunct in weight management.   Obesity Education Performed Today:  She was weighed on the bioimpedance scale and results were discussed and documented in the synopsis.  We discussed obesity as a disease and the importance of a more detailed evaluation of all the factors contributing to the disease.  We discussed the importance of long term lifestyle changes which include nutrition, exercise and behavioral modifications as well as the importance of customizing this to her specific health and social needs.  We discussed the benefits of reaching a healthier weight to alleviate the symptoms of existing conditions and reduce the risks of the biomechanical, metabolic and psychological effects of obesity.  We reviewed  the four pillars of obesity medicine and importance of using a multimodal approach.  We reviewed the basic principles in weight management.   Saria N Calip appears to be in the action stage of change and states they are ready to start intensive lifestyle modifications and behavioral modifications.  I have spent 25 minutes in the care of the patient today including: 2 minutes before the visit reviewing and preparing the chart. 20 minutes face-to-face assessing and reviewing listed medical problems as outlined in obesity care plan, providing nutritional and behavioral counseling on topics outlined in the obesity care plan, counseling regarding anti-obesity medication as outlined in obesity care plan, independently interpreting test results and goals of care, as described in assessment and plan, and reviewing and discussing biometric information and progress 3 minutes after the visit updating chart and documentation of encounter.  Reviewed by clinician on day of visit: allergies,  medications, problem list, medical history, surgical history, family history, social history, and previous encounter notes pertinent to obesity diagnosis.  Tamura Lasky d. Kailynne Ferrington, NP-C

## 2024-08-03 DIAGNOSIS — F3342 Major depressive disorder, recurrent, in full remission: Secondary | ICD-10-CM | POA: Diagnosis not present

## 2024-08-03 DIAGNOSIS — F5101 Primary insomnia: Secondary | ICD-10-CM | POA: Diagnosis not present

## 2024-08-03 DIAGNOSIS — F411 Generalized anxiety disorder: Secondary | ICD-10-CM | POA: Diagnosis not present

## 2024-08-03 DIAGNOSIS — F900 Attention-deficit hyperactivity disorder, predominantly inattentive type: Secondary | ICD-10-CM | POA: Diagnosis not present

## 2024-08-17 ENCOUNTER — Other Ambulatory Visit: Payer: Self-pay | Admitting: Internal Medicine

## 2024-08-17 DIAGNOSIS — E039 Hypothyroidism, unspecified: Secondary | ICD-10-CM | POA: Diagnosis not present

## 2024-08-17 DIAGNOSIS — E669 Obesity, unspecified: Secondary | ICD-10-CM | POA: Diagnosis not present

## 2024-08-17 DIAGNOSIS — Z1231 Encounter for screening mammogram for malignant neoplasm of breast: Secondary | ICD-10-CM

## 2024-08-26 ENCOUNTER — Encounter: Payer: Self-pay | Admitting: Neurosurgery

## 2024-08-31 ENCOUNTER — Encounter (INDEPENDENT_AMBULATORY_CARE_PROVIDER_SITE_OTHER): Payer: Self-pay

## 2024-08-31 DIAGNOSIS — Z124 Encounter for screening for malignant neoplasm of cervix: Secondary | ICD-10-CM | POA: Diagnosis not present

## 2024-08-31 DIAGNOSIS — Z1331 Encounter for screening for depression: Secondary | ICD-10-CM | POA: Diagnosis not present

## 2024-08-31 DIAGNOSIS — Z79890 Hormone replacement therapy (postmenopausal): Secondary | ICD-10-CM | POA: Diagnosis not present

## 2024-08-31 DIAGNOSIS — N951 Menopausal and female climacteric states: Secondary | ICD-10-CM | POA: Diagnosis not present

## 2024-08-31 DIAGNOSIS — Z01419 Encounter for gynecological examination (general) (routine) without abnormal findings: Secondary | ICD-10-CM | POA: Diagnosis not present

## 2024-08-31 DIAGNOSIS — F411 Generalized anxiety disorder: Secondary | ICD-10-CM | POA: Diagnosis not present

## 2024-08-31 DIAGNOSIS — N898 Other specified noninflammatory disorders of vagina: Secondary | ICD-10-CM | POA: Diagnosis not present

## 2024-09-02 ENCOUNTER — Inpatient Hospital Stay
Admission: RE | Admit: 2024-09-02 | Discharge: 2024-09-02 | Disposition: A | Source: Ambulatory Visit | Attending: Neurosurgery

## 2024-09-02 DIAGNOSIS — M5010 Cervical disc disorder with radiculopathy, unspecified cervical region: Secondary | ICD-10-CM

## 2024-09-02 DIAGNOSIS — M50123 Cervical disc disorder at C6-C7 level with radiculopathy: Secondary | ICD-10-CM | POA: Diagnosis not present

## 2024-09-02 DIAGNOSIS — R140 Abdominal distension (gaseous): Secondary | ICD-10-CM | POA: Insufficient documentation

## 2024-09-02 DIAGNOSIS — N83202 Unspecified ovarian cyst, left side: Secondary | ICD-10-CM | POA: Insufficient documentation

## 2024-09-02 DIAGNOSIS — M4723 Other spondylosis with radiculopathy, cervicothoracic region: Secondary | ICD-10-CM | POA: Diagnosis not present

## 2024-09-02 DIAGNOSIS — E669 Obesity, unspecified: Secondary | ICD-10-CM | POA: Insufficient documentation

## 2024-09-07 ENCOUNTER — Ambulatory Visit (INDEPENDENT_AMBULATORY_CARE_PROVIDER_SITE_OTHER): Admitting: Nurse Practitioner

## 2024-09-08 ENCOUNTER — Ambulatory Visit
Admission: RE | Admit: 2024-09-08 | Discharge: 2024-09-08 | Disposition: A | Discharge status: Home / Self Care | Source: Ambulatory Visit | Attending: Internal Medicine | Admitting: Internal Medicine

## 2024-09-08 DIAGNOSIS — Z1231 Encounter for screening mammogram for malignant neoplasm of breast: Secondary | ICD-10-CM | POA: Diagnosis not present

## 2024-09-11 DIAGNOSIS — F411 Generalized anxiety disorder: Secondary | ICD-10-CM | POA: Diagnosis not present

## 2024-09-17 DIAGNOSIS — R053 Chronic cough: Secondary | ICD-10-CM | POA: Diagnosis not present

## 2024-09-17 DIAGNOSIS — G4733 Obstructive sleep apnea (adult) (pediatric): Secondary | ICD-10-CM | POA: Diagnosis not present

## 2024-09-17 DIAGNOSIS — J4540 Moderate persistent asthma, uncomplicated: Secondary | ICD-10-CM | POA: Diagnosis not present

## 2024-09-17 DIAGNOSIS — K219 Gastro-esophageal reflux disease without esophagitis: Secondary | ICD-10-CM | POA: Diagnosis not present

## 2024-09-21 ENCOUNTER — Ambulatory Visit (INDEPENDENT_AMBULATORY_CARE_PROVIDER_SITE_OTHER): Admitting: Nurse Practitioner

## 2024-09-23 ENCOUNTER — Other Ambulatory Visit
Admission: RE | Admit: 2024-09-23 | Discharge: 2024-09-23 | Disposition: A | Discharge status: Home / Self Care | Source: Ambulatory Visit | Attending: Pulmonary Disease | Admitting: Pulmonary Disease

## 2024-09-23 DIAGNOSIS — J4540 Moderate persistent asthma, uncomplicated: Secondary | ICD-10-CM | POA: Insufficient documentation

## 2024-09-23 DIAGNOSIS — Z0000 Encounter for general adult medical examination without abnormal findings: Secondary | ICD-10-CM | POA: Diagnosis not present

## 2024-09-23 LAB — D-DIMER, QUANTITATIVE: D-Dimer, Quant: 0.29 ug{FEU}/mL (ref 0.00–0.50)

## 2024-09-28 ENCOUNTER — Ambulatory Visit (INDEPENDENT_AMBULATORY_CARE_PROVIDER_SITE_OTHER): Admitting: Nurse Practitioner

## 2024-09-29 ENCOUNTER — Encounter (INDEPENDENT_AMBULATORY_CARE_PROVIDER_SITE_OTHER): Payer: Self-pay | Admitting: Nurse Practitioner

## 2024-09-29 ENCOUNTER — Ambulatory Visit (INDEPENDENT_AMBULATORY_CARE_PROVIDER_SITE_OTHER): Payer: Self-pay | Admitting: Nurse Practitioner

## 2024-09-29 VITALS — BP 126/76 | HR 61 | Temp 98.5°F | Ht 62.0 in | Wt 170.0 lb

## 2024-09-29 DIAGNOSIS — G4733 Obstructive sleep apnea (adult) (pediatric): Secondary | ICD-10-CM

## 2024-09-29 DIAGNOSIS — E66811 Obesity, class 1: Secondary | ICD-10-CM | POA: Diagnosis not present

## 2024-09-29 DIAGNOSIS — R0602 Shortness of breath: Secondary | ICD-10-CM | POA: Diagnosis not present

## 2024-09-29 DIAGNOSIS — Z1331 Encounter for screening for depression: Secondary | ICD-10-CM | POA: Diagnosis not present

## 2024-09-29 DIAGNOSIS — E559 Vitamin D deficiency, unspecified: Secondary | ICD-10-CM | POA: Diagnosis not present

## 2024-09-29 DIAGNOSIS — E039 Hypothyroidism, unspecified: Secondary | ICD-10-CM | POA: Diagnosis not present

## 2024-09-29 DIAGNOSIS — E782 Mixed hyperlipidemia: Secondary | ICD-10-CM

## 2024-09-29 DIAGNOSIS — R5383 Other fatigue: Secondary | ICD-10-CM

## 2024-09-29 DIAGNOSIS — Z6831 Body mass index (BMI) 31.0-31.9, adult: Secondary | ICD-10-CM

## 2024-09-29 DIAGNOSIS — R7303 Prediabetes: Secondary | ICD-10-CM

## 2024-09-29 DIAGNOSIS — R748 Abnormal levels of other serum enzymes: Secondary | ICD-10-CM | POA: Diagnosis not present

## 2024-09-29 DIAGNOSIS — T733XXA Exhaustion due to excessive exertion, initial encounter: Secondary | ICD-10-CM | POA: Insufficient documentation

## 2024-09-29 NOTE — Progress Notes (Signed)
 [address] [place],  [place], [place] [address]  Office: [phone]  /  Fax: [phone]   Subjective   Initial Visit  Heather Faulkner (MR# [id number]) is a 52 y.o. female who presents for evaluation and treatment of obesity and related comorbidities. Current BMI is Body mass index is 31.09 kg/m. Heather Faulkner has been struggling with her weight for many years and has been unsuccessful in either losing weight, maintaining weight loss, or reaching her healthy weight goal.  Heather Faulkner is currently in the action stage of change and ready to dedicate time achieving and maintaining a healthier weight. Heather Faulkner is interested in becoming our patient and working on intensive lifestyle modifications including (but not limited to) diet and exercise for weight loss. Heather Faulkner first noticed weight gain in college and with her pregnancies.  She has tried Qysmia and Phentermine in the past but did cause side effects so was stopped. Has lost approx 25-30 pound in the past with use of less cabs and more lean protein and vegetable/fruits. Heather Faulkner does follow with pulmonology for chronic cough.  She was started on Dupixent , Advair 250/50 mcg 1 puff BID, He has also prescribed Prednisone 5 mg daily for 30 days but has not taken it as of yet.  She does see rheumatology for joint pain and is on Celebrex 100 mg BID as needed. She is on ergocalciferol 50000 units once a week for Vit D deficiency No family members with MEN2  or MTC.  She is also on Furosemide 20 mg Prn for swelling of hands/feet.   Weight history:  When asked how their weight has affected their life and health, she states: Contributed to medical problems, Contributed to orthopedic problems or mobility issues, Having fatigue, Having poor endurance, Problems with eating patterns, and Has affected mood   When asked what else they would like to accomplish? She states: Adopt a healthier eating pattern and lifestyle, Improve energy levels and physical activity, Improve existing  medical conditions, Improve quality of life, and safely stop nightly CPAP  She starting to note weight gain during : college-adulthood and pregnancy. Also peri menopause  Life events associated with weight gain include : pregnancy, education, and perimenopause.   Other contributing factors: disruption of circadian rhythm / sleep disordered breathing, consumption of processed foods, use of obesogenic medications: Psychotropic medications and Antiepileptics, moderate to high levels of stress, reduced physical activity, menopause, sedentary job, and need for convenient foods.  Their highest weight has been:  184 lbs.  Desired weight: 125  Previous weight-loss programs : Weight Watchers and Balanced Plate / Portion Control.  Their maximum weight loss was:  25 lbs.  Their greatest challenge with dieting: difficulty maintaining reduced calorie state and meal preparation and cooking.  Current or previous pharmacotherapy: Phentermine and Other: Qysmia.  Response to medication: Had side effects so it was discontinued   Nutritional History:  Current nutrition plan: None.  How many times do you eat outside the home: 2-4 per week  How often do they skip meals: does not skip meals  What beverages do they drink: water , caffeinated beverages , regular soda , and milk.   Use of artificial sweetners : Yes  Food intolerances or dislikes: none.  Food triggers: Stress, Boredom, Seeking reward, To help comfort self, and When Sad.  Food cravings: Sugary and Starches / Carbohydrates  Do they struggle with excessive hunger or portion control : Yes    Physical Activity:  Current level of physical activity: None  Barriers to Exercise:  no barriers   Past medical history includes:   Past Medical History:  Diagnosis Date   ADD (attention deficit disorder)    Allergic rhinitis    Anemia    Anxiety    Asthma    Cervicalgia    Chronic cough    Complication of anesthesia    Itching after  epidural   Depression    GERD (gastroesophageal reflux disease)    High cholesterol    Hypersomnia with sleep apnea    Hypothyroidism    Irritable bowel syndrome    Joint pain    OSA (obstructive sleep apnea)    Osteoarthritis    Osteoarthritis    Perioral dermatitis    Persistent hypersomnia    Sleep apnea    Tubular adenoma    Vitamin D deficiency    Wears contact lenses    sometimes   Labs reviewed from 09/23/24 CBC WBC (White Blood Cell Count) 4.1 - 10.2 10^3/uL 5.6  RBC (Red Blood Cell Count) 4.04 - 5.48 10^6/uL 3.94 Low   Hemoglobin 12.0 - 15.0 gm/dL 87.5  Hematocrit 64.9 - 47.0 % 37.6  MCV (Mean Corpuscular Volume) 80.0 - 100.0 fl 95.4  MCH (Mean Corpuscular Hemoglobin) 27.0 - 31.2 pg 31.5 High   MCHC (Mean Corpuscular Hemoglobin Concentration) 32.0 - 36.0 gm/dL 66.9  Platelet Count 849 - 450 10^3/uL 332  RDW-CV (Red Cell Distribution Width) 11.6 - 14.8 % 13.3  MPV (Mean Platelet Volume) 9.4 - 12.4 fl 9.1 Low   Neutrophils 1.50 - 7.80 10^3/uL 3.27  Lymphocytes 1.00 - 3.60 10^3/uL 1.51  Monocytes 0.00 - 1.50 10^3/uL 0.49  Eosinophils 0.00 - 0.55 10^3/uL 0.27  Basophils 0.00 - 0.09 10^3/uL 0.08  Neutrophil % 32.0 - 70.0 % 58.1  Lymphocyte % 10.0 - 50.0 % 26.8  Monocyte % 4.0 - 13.0 % 8.7  Eosinophil % 1.0 - 5.0 % 4.8  Basophil% 0.0 - 2.0 % 1.4  Immature Granulocyte % <=0.7 % 0.2  Immature Granulocyte Count <=0.06 10^3/L 0.01   CMP Units 6 d ago Comments  Glucose 70 - 110 mg/dL 94   Sodium 863 - 854 mmol/L 141   Potassium 3.6 - 5.1 mmol/L 4.0   Chloride 97 - 109 mmol/L 105   Carbon Dioxide (CO2) 22.0 - 32.0 mmol/L 28.5   Urea Nitrogen (BUN) 7 - 25 mg/dL 21   Creatinine 0.6 - 1.1 mg/dL 0.9   Glomerular Filtration Rate (eGFR) >60 mL/min/1.73sq m 75 CKD-EPI (2021) does not include patient's race in the calculation of eGFR.  Monitoring changes of plasma creatinine and eGFR over time is useful for monitoring kidney  function.  Interpretive Ranges for eGFR (CKD-EPI 2021):  eGFR:       >60 mL/min/1.73 sq. m - Normal eGFR:       30-59 mL/min/1.73 sq. m - Moderately Decreased eGFR:       15-29 mL/min/1.73 sq. m  - Severely Decreased eGFR:       < 15 mL/min/1.73 sq. m  - Kidney Failure   Note: These eGFR calculations do not apply in acute situations when eGFR is changing rapidly or patients on dialysis.  Calcium 8.7 - 10.3 mg/dL 9.2   AST 8 - 39 U/L 14   ALT 5 - 38 U/L 10   Alk Phos (alkaline Phosphatase) 34 - 104 U/L 123 High    Albumin 3.5 - 4.8 g/dL 4.4   Bilirubin, Total 0.3 - 1.2 mg/dL 0.4   Protein, Total 6.1 - 7.9 g/dL 6.6  A/G Ratio 1.0 - 5.0 gm/dL 2.0     J8r Ref Range & Units 6 d ago  Hemoglobin A1C 4.2 - 5.6 % 5.2  Average Blood Glucose (Calc) mg/dL 896   Lipid Panel w/calc LDL Specimen: Blood Component Ref Range & Units 6 d ago  Cholesterol, Total 100 - 200 mg/dL 779 High   Triglyceride 35 - 199 mg/dL 98  HDL (High Density Lipoprotein) Cholesterol 35.0 - 85.0 mg/dL 25.4  LDL Calculated 0 - 130 mg/dL 873  VLDL Cholesterol mg/dL 20  Cholesterol/HDL Ratio 3.0   Thyroid  Stimulating-Hormone (TSH) Specimen: Blood Component Ref Range & Units 6 d ago Comments  Thyroid  Stimulating Hormone (TSH) 0.450 - 5.330 uIU/mL 1.251    Vitamin B12 Specimen: Blood Component Ref Range & Units 6 d ago  Vitamin B12 >300 pg/mL 382   Vitamin D, 25-Hydroxy - [facility] Specimen: Blood Component Ref Range & Units 6 d ago Comments  Vitamin D, 25-Hydroxy - [facility] 30.0 - 100.0 ng/mL 36.6    Objective   BP 126/76   Pulse 61   Temp 98.5 F (36.9 C)   Ht 5' 2 (1.575 m)   Wt 170 lb (77.1 kg)   SpO2 99%   BMI 31.09 kg/m  She was weighed on the bioimpedance scale: Body mass index is 31.09 kg/m.    Anthropometrics:  Vitals Temp: 98.5 F (36.9 C) BP: 126/76 Pulse Rate: 61 SpO2: 99 %   Anthropometric Measurements Height: 5' 2 (1.575 m) Weight: 170 lb (77.1  kg) BMI (Calculated): 31.09 Starting Weight: 170 lb Peak Weight: 184 lb Waist Measurement : 34 inches   Body Composition  Body Fat %: 40.9 % Fat Mass (lbs): 69.8 lbs Muscle Mass (lbs): 95.8 lbs Total Body Water  (lbs): 68.8 lbs Visceral Fat Rating : 10   Other Clinical Data RMR: [id number] Fasting: yes Labs: yes Today's Visit #: 1 Starting Date: 09/29/24    Physical Exam:  General: She is overweight, cooperative, alert, well developed, and in no acute distress. PSYCH: Has normal mood, affect and thought process.   HEENT: EOMI, sclerae are anicteric. Lungs: Normal breathing effort, no conversational dyspnea. Extremities: No edema.  Neurologic: No gross sensory or motor deficits. No tremors or fasciculations noted.    Diagnostic Data Reviewed  EKG: Normal sinus rhythm, rate 61. No conduction abnormalities, abnormal Q waves or chamber enlargement.  Indirect Calorimeter completed today shows a VO2 of 212 and a REE of 1454.  Her calculated basal metabolic rate is 8600 thus her resting energy expenditure same as calculated.  Depression Screen  Heather Faulkner's PHQ-9 score was: 3.     09/29/2024   10:08 AM  Depression screen PHQ 2/9  Decreased Interest 0  Down, Depressed, Hopeless 0  PHQ - 2 Score 0  Altered sleeping 1  Tired, decreased energy 0  Change in appetite 1  Feeling bad or failure about yourself  0  Trouble concentrating 1  Moving slowly or fidgety/restless 0  Suicidal thoughts 0  PHQ-9 Score 3    Screening for Sleep Related Breathing Disorders  Heather Faulkner admits to daytime somnolence and admits to waking up still tired. Patient has a history of symptoms of daytime fatigue, morning fatigue, and morning headache. Heather Faulkner generally gets 6-8 hours of sleep per night, and states that she has difficulty falling asleep, nightime awakenings, and difficulty falling back asleep if awakened. Snoring is present. Apneic episodes are present. Epworth Sleepiness Score is 6.  She does have  sleep apnea and is on CPAP  100% of the time, has been on CPAP since 03/2023    Assessment and Plan   Class 1 obesity with serious comorbidity and body mass index (BMI) of 31.0 to 31.9 in adult, unspecified obesity type TREATMENT PLAN FOR OBESITY:  Recommended Dietary Goals  Verlie is currently in the action stage of change. As such, her goal is to implement medically supervised obesity management plan.  She has agreed to implement: the Category 1 plan - 1000 kcal per day  Behavioral Intervention  We discussed the following Behavioral Modification Strategies today: increasing lean protein intake to established goals, decreasing simple carbohydrates , increasing vegetables, increasing lower glycemic fruits, increasing fiber rich foods, avoiding skipping meals, increasing water  intake, work on meal planning and preparation, reading food labels , keeping healthy foods at home, identifying sources and decreasing liquid calories, decreasing eating out or consumption of processed foods, and making healthy choices when eating convenient foods, planning for success, and better snacking choices  Additional resources provided today: Handout on healthy eating and balanced plate, Handout on complex carbohydrates and lean sources of protein, and Handout principles of weight management  Recommended Physical Activity Goals  Tiwanda has been advised to work up to 150 minutes of moderate intensity aerobic activity a week and strengthening exercises 2-3 times per week for cardiovascular health, weight loss maintenance and preservation of muscle mass.   She has agreed to :  Think about enjoyable ways to increase daily physical activity and overcoming barriers to exercise, Increase physical activity in their day and reduce sedentary time (increase NEAT)., Increase volume of physical activity to a goal of 240 minutes a week, and Combine aerobic and strengthening exercises for efficiency and improved cardiometabolic  health.  Medical Interventions and Pharmacotherapy We will work on building a therapist, art and behavioral strategies. We will discuss the role of pharmacotherapy as an adjunct at subsequent visits.   ASSOCIATED CONDITIONS ADDRESSED TODAY  Other Fatigue  Heather Faulkner does feel that her weight is causing her energy to be lower than it should be. Fatigue may be related to obesity, depression or many other causes. Labs will be ordered, and in the meanwhile, Heather Faulkner will focus on self care including making healthy food choices, increasing physical activity and focusing on stress reduction. - EKG 12 lead  Shortness of Breath on exertion Heather Faulkner notes increasing shortness of breath with physical activity and seems to be worsening over time with weight gain. She notes getting out of breath sooner with activity than she used to. This has not gotten worse recently. Heather Faulkner denies shortness of breath at rest or orthopnea.  Mixed hyperlipidemia Focus on implementing category 1 meal plan, limit saturated fats Loss of 10-15% body weight can improve lipid levels Focus on getting 150 minutes a week of moderate to high intensity exercise   Elevated alkaline phosphatase level Will recheck CMP today and if alk phos is still elevated will plan abdomina RUQ U/S -     Comprehensive metabolic panel with GFR  Prediabetes a1c 5.9 2023 Start Category 1  meal plan, limit simple carbohydrates Decreasing body weight by 10-15% can improve glucose levels Continue exercise with current goal of 150 minutes of moderate to high intensity exercise/week.  -     Insulin, random -     Comprehensive metabolic panel with GFR  Vitamin D deficiency       Continue ergocalciferol 50000 units once a week- last result 1 week ago was in normal range  Hypothyroidism, unspecified type  Continue to follow with endocrinology and continue Levothyroxine 125 mcg daily except Sat and Sun take 150 mcg  OSA on CPAP       Use  CPAP 100% of the time       Decreasing body weight by 10-15% can improve AHI       Follow-up  She was informed of the importance of frequent follow-up visits to maximize her success with intensive lifestyle modifications for her multiple health conditions. She was informed we would discuss her lab results at her next visit unless there is a critical issue that needs to be addressed sooner. Heather Faulkner agreed to keep her next visit at the agreed upon time to discuss these results.  Attestation Statement  This is the patient's intake visit at [facility]. The patient's Health Questionnaire was reviewed at length. Included in the packet: current and past health history, medications, allergies, ROS, gynecologic history (women only), surgical history, family history, social history, weight history, weight loss surgery history (for those that have had weight loss surgery), nutritional evaluation, mood and food questionnaire, PHQ9, Epworth questionnaire, sleep habits questionnaire, patient life and health improvement goals questionnaire. These will all be scanned into the patient's chart under media.   During the visit, I independently reviewed the patient's EKG, previous labs, bioimpedance scale results, and indirect calorimetry results. I used this information to medically tailor a meal plan for the patient that will help her to lose weight and will improve her obesity-related conditions. I performed a medically necessary appropriate examination and/or evaluation. I discussed the assessment and treatment plan with the patient. The patient was provided an opportunity to ask questions and all were answered. The patient agreed with the plan and demonstrated an understanding of the instructions. Labs were ordered at this visit and will be reviewed at the next visit unless critical results need to be addressed immediately. Clinical information was updated and documented in the EMR.   In addition,  they received basic education on identification of processed foods and reduction of these, different sources of lean proteins and complex carbohydrates and how to eat balanced by incorporation of whole foods.  Reviewed by clinician on day of visit: allergies, medications, problem list, medical history, surgical history, family history, social history, and previous encounter notes.    I personally spent a total of 50 minutes in the care of the patient today including preparing to see the patient, getting/reviewing separately obtained history, performing a medically appropriate exam/evaluation, counseling and educating, placing orders, documenting clinical information in the EHR, and coordinating care.  Excluding time for depression screening, indirect calorimetry and EKG.    Heather Faulkner ANP-C

## 2024-09-29 NOTE — Addendum Note (Signed)
 Addended byBETHA DONALDS, Remy Dia L on: 09/29/2024 11:41 AM   Modules accepted: Orders

## 2024-09-30 ENCOUNTER — Ambulatory Visit (INDEPENDENT_AMBULATORY_CARE_PROVIDER_SITE_OTHER): Payer: Self-pay | Admitting: Nurse Practitioner

## 2024-09-30 LAB — COMPREHENSIVE METABOLIC PANEL WITH GFR
ALT: 14 IU/L (ref 0–32)
AST: 19 IU/L (ref 0–40)
Albumin: 4.4 g/dL (ref 3.8–4.9)
Alkaline Phosphatase: 143 IU/L — ABNORMAL HIGH (ref 49–135)
BUN/Creatinine Ratio: 18 (ref 9–23)
BUN: 14 mg/dL (ref 6–24)
Bilirubin Total: 0.4 mg/dL (ref 0.0–1.2)
CO2: 22 mmol/L (ref 20–29)
Calcium: 9.8 mg/dL (ref 8.7–10.2)
Chloride: 103 mmol/L (ref 96–106)
Creatinine, Ser: 0.76 mg/dL (ref 0.57–1.00)
Globulin, Total: 2.4 g/dL (ref 1.5–4.5)
Glucose: 87 mg/dL (ref 70–99)
Potassium: 4.3 mmol/L (ref 3.5–5.2)
Sodium: 141 mmol/L (ref 134–144)
Total Protein: 6.8 g/dL (ref 6.0–8.5)
eGFR: 92 mL/min/1.73 (ref >59)

## 2024-09-30 LAB — INSULIN, RANDOM: INSULIN: 3.2 u[IU]/mL (ref 2.6–24.9)

## 2024-10-01 DIAGNOSIS — M25541 Pain in joints of right hand: Secondary | ICD-10-CM | POA: Diagnosis not present

## 2024-10-01 DIAGNOSIS — Z6831 Body mass index (BMI) 31.0-31.9, adult: Secondary | ICD-10-CM | POA: Diagnosis not present

## 2024-10-01 DIAGNOSIS — E039 Hypothyroidism, unspecified: Secondary | ICD-10-CM | POA: Diagnosis not present

## 2024-10-01 DIAGNOSIS — R053 Chronic cough: Secondary | ICD-10-CM | POA: Diagnosis not present

## 2024-10-01 DIAGNOSIS — Z1339 Encounter for screening examination for other mental health and behavioral disorders: Secondary | ICD-10-CM | POA: Diagnosis not present

## 2024-10-01 DIAGNOSIS — M25542 Pain in joints of left hand: Secondary | ICD-10-CM | POA: Diagnosis not present

## 2024-10-01 DIAGNOSIS — Z0000 Encounter for general adult medical examination without abnormal findings: Secondary | ICD-10-CM | POA: Diagnosis not present

## 2024-10-01 DIAGNOSIS — Z1331 Encounter for screening for depression: Secondary | ICD-10-CM | POA: Diagnosis not present

## 2024-10-01 DIAGNOSIS — G4733 Obstructive sleep apnea (adult) (pediatric): Secondary | ICD-10-CM | POA: Diagnosis not present

## 2024-10-01 DIAGNOSIS — J4520 Mild intermittent asthma, uncomplicated: Secondary | ICD-10-CM | POA: Diagnosis not present

## 2024-10-08 ENCOUNTER — Encounter: Payer: Self-pay | Admitting: Neurosurgery

## 2024-10-08 ENCOUNTER — Ambulatory Visit: Admitting: Neurosurgery

## 2024-10-08 VITALS — BP 110/78 | Wt 169.0 lb

## 2024-10-08 DIAGNOSIS — M5010 Cervical disc disorder with radiculopathy, unspecified cervical region: Secondary | ICD-10-CM

## 2024-10-08 DIAGNOSIS — M542 Cervicalgia: Secondary | ICD-10-CM

## 2024-10-08 NOTE — Progress Notes (Signed)
 Progress Note: Referring Physician:  Sadie Manna, MD [address] [facility] [place],  [place] [address]  Primary Physician:  Sadie Manna, MD  Chief Complaint:  f/u of cervical MRI  History of Present Illness: Heather Faulkner is a 53 y.o. female who presents today for follow up to review recent cervical MRI. Today she reports ongoing neck pain but reports resolution of her right arm pain. She has ongoing neck pain worse on the left than right.  She continues to have numbness in both hands R>L.  03/12/24 Heather Faulkner a 53 y.o presenting for phone follow up after initiation PT. She has had 2-3 sessions and feels like things are unimproved. She was instructed to do traction at home and feels like this is making things worse. She has another session tomorrow.    02/11/24 Heather Faulkner is a 53 y.o. female who presents with the chief complaint of ongoing cervical complaints.  She was seen by Dr. Katrina in 2023 who recommended physical therapy. Her symptoms resolved until a recurrence a couple of weeks ago after the loss of her father with precipitated travel.  Today she reports several weeks of recurrent neck pain that feels like a squeezing pressure with associated pain down her right arm and numbness and tingling into all of her fingertips. She was seen at [facility] and has undergone 1 session of PT this year.  She has also completed a steroid taper as well as Flexeril  and over-the-counter medications with minimal relief.  Despite this she feels as though her symptoms may have improved some over the last couple of weeks.  She does endorse some subjective weakness particularly with grasping objects and lifting.  She denies any left-sided symptoms.   09/25/2022 Dr. Clois Heather Faulkner is here today with a chief complaint of tightness in her neck, numbness into her right arm, and discomfort into her hand.  This began approximate 2 years ago and has worsened over  the past several months.  She reports tightness, dullness, stiffness, aching, and burning that is made worse by lifting and typing.  Ice and rest have helped.  Ibuprofen has helped.  Her neck pain is constant, while her arm pain is intermittent.  Exam: Today's Vitals   10/08/24 0939  BP: 110/78  Weight: 169 lb (76.7 kg)  PainSc: 5   PainLoc: Neck   Body mass index is 30.91 kg/m.    NEUROLOGICAL:  General: In no acute distress.   Awake, alert, oriented to person, place, and time.  Pupils equal round and reactive to light.  Facial tone is symmetric.    Strength: Side Biceps Triceps Deltoid Interossei Grip Wrist Ext. Wrist Flex.  R 5 5 5 5 5 5 5   L 5 5 5 5 5 5 5      Imaging: 09/02/24 FINDINGS: Alignment: Stable straightening and mild convex right scoliosis. Minimal retrolisthesis at C4-5 and C5-6 appears unchanged.   Vertebrae: No acute or suspicious osseous findings. Similar chronic endplate degenerative changes at C4-5 and C5-6.   Cord: Normal in signal and caliber.   Posterior Fossa, vertebral arteries, paraspinal tissues: Visualized portions of the posterior fossa appear unremarkable.Bilateral vertebral artery flow voids. No significant paraspinal findings.   Disc levels:   C2-3: Normal interspace.   C3-4: Preserved disc height with stable mild disc bulging and asymmetric uncinate spurring on the right. Stable mild right foraminal narrowing. The spinal canal and left foramen are patent.   C4-5: Spondylosis with loss of  disc height and posterior osteophytes covering diffusely bulging disc material. Left foraminal disc osteophyte complex contributes to moderate to severe left foraminal narrowing and probable chronic left C5 nerve root encroachment. Moderate right foraminal narrowing appears unchanged. No significant spinal stenosis.   C5-6: Chronic spondylosis with loss of disc height and posterior osteophytes covering diffusely bulging disc material.  Unchanged moderate to severe left and mild-to-moderate right foraminal narrowing. No significant central spinal stenosis.   C6-7: Mild disc bulging and bilateral facet hypertrophy. No residual right foraminal disc protrusion identified. There is mild foraminal narrowing bilaterally without definite nerve root impingement. The spinal canal is patent.   C7-T1: The disc appears normal. Mild bilateral facet hypertrophy. No spinal stenosis or significant foraminal narrowing.   IMPRESSION: 1. No acute findings or clear explanation for the patient's symptoms. 2. No residual right foraminal disc protrusion at C6-7. Mild foraminal narrowing bilaterally without definite nerve root impingement. 3. Stable chronic spondylosis at C4-5 and C5-6 with resulting moderate to severe left foraminal narrowing and probable chronic exiting nerve root encroachment.     Electronically Signed   By: Elsie Perone M.D.   On: 09/05/2024 12:28  Assessment and Plan: Heather Faulkner is a pleasant 53 y.o. female with ongoing neck pain. She reports interval resolution of her right arm pain but ongoing left scapular pain. We dicussed possible treatment options  but she is still hesitant about cervical injections and surgery. I recommended neck exercises and referral to PT for dry needling and myofacial release. She will keep us  updated on how this goes and should she decide to proceed with injections or surgical discussion we will arrange this.  She will continue with Flexeril  for now and update us  if she needs a refill. We discuss medications side effects and appropriate use in detail.  I spent a total of 52 minutes in both face-to-face and non-face-to-face activities for this visit on the date of this encounter including review of records, review of imaging, discussion of differential diagnosis, physical exam, and order placement   Edsel Goods PA-C Neurosurgery

## 2024-10-09 NOTE — Addendum Note (Signed)
 Addended by: GREGORY EDSEL CROME on: 10/09/2024 07:49 AM   Modules accepted: Level of Service

## 2024-10-13 ENCOUNTER — Encounter (INDEPENDENT_AMBULATORY_CARE_PROVIDER_SITE_OTHER): Payer: Self-pay | Admitting: Nurse Practitioner

## 2024-10-13 ENCOUNTER — Ambulatory Visit (INDEPENDENT_AMBULATORY_CARE_PROVIDER_SITE_OTHER): Payer: Self-pay | Admitting: Nurse Practitioner

## 2024-10-13 VITALS — BP 123/71 | HR 63 | Temp 98.0°F | Ht 62.0 in | Wt 172.0 lb

## 2024-10-13 DIAGNOSIS — R748 Abnormal levels of other serum enzymes: Secondary | ICD-10-CM

## 2024-10-13 DIAGNOSIS — E782 Mixed hyperlipidemia: Secondary | ICD-10-CM | POA: Diagnosis not present

## 2024-10-13 DIAGNOSIS — E039 Hypothyroidism, unspecified: Secondary | ICD-10-CM

## 2024-10-13 DIAGNOSIS — Z6831 Body mass index (BMI) 31.0-31.9, adult: Secondary | ICD-10-CM | POA: Diagnosis not present

## 2024-10-13 DIAGNOSIS — R7303 Prediabetes: Secondary | ICD-10-CM | POA: Diagnosis not present

## 2024-10-13 DIAGNOSIS — E66811 Obesity, class 1: Secondary | ICD-10-CM | POA: Diagnosis not present

## 2024-10-13 NOTE — Progress Notes (Signed)
 Office: [phone]  /  Fax: [phone]  WEIGHT SUMMARY AND BIOMETRICS  Weight Lost Since Last Visit: 0  Weight Gained Since Last Visit: 2 lb   Vitals Temp: 98 F (36.7 C) BP: 123/71 Pulse Rate: 63 SpO2: 98 %   Anthropometric Measurements Height: 5' 2 (1.575 m) Weight: 172 lb (78 kg) BMI (Calculated): 31.45 Weight at Last Visit: 170 lb Weight Lost Since Last Visit: 0 Weight Gained Since Last Visit: 2 lb Starting Weight: 170 lb Total Weight Loss (lbs): 0 lb (0 kg) Peak Weight: 184 lb Waist Measurement : 34 inches   Body Composition  Body Fat %: 42.37 % Fat Mass (lbs): 73 lbs Muscle Mass (lbs): 94.4 lbs Total Body Water  (lbs): 72 lbs Visceral Fat Rating : 10   Other Clinical Data Fasting: no Labs: no Today's Visit #: 2 Starting Date: 09/29/24    Total Weight Loss: 0 pounds  Bio Impedance Data reviewed with patient: Muscle is up 1.4 pounds, adipose decreased 3.2 pounds. PBF decreased from 42.37% to 40.9%  HPI  Chief Complaint: OBESITY  Heather Faulkner is here to discuss her progress with her obesity treatment plan. She is on the the Category 1 Plan and states she is following her eating plan approximately 50 % of the time. She states she is currently not exercising.   Interval History:  Since last office visit she used up her current groceries and went on vacation for 5 days to [place].  She ate off plan but did more walking.  For Thanksgiving she is ordering from the [employer] for Thanksgiving. She has been drinking 80-90 ounces of water  daily. She has not been skipping meals. Has been doing [employer]. She does still crave some sweets. Has done some mini kind bars Breakfast: 2 boiled eggs and cheese toast Lunch: sandwich or Healthy Choice power bowls Snack or breakfast: cottage cheese and greek yogurt with berries.  Dinner:Another sandwich or lean meat with salad. Or vegetables.   She does plan to use her stationary bike 20 minutes 2  times a week.   Heather Faulkner has hypothyroidism and is currently on levothyroxine 125 mcg every day and latest check was in therapeutic range at 1.251 on 09/23/24. She does have mixed hyperlipidemia but is currently on no medication for treatment. Plans to work on nutrition, exercise and weight loss. Coronary artery calcium score 05/31/23 was 0  Lab results are reviewed with patient in detail.  Normal kidney functions, electrolytes, fasting glucose, fasting insulin.  Mild elevation of alkaline phosphatase with normal ALT/AST. Last metabolic panel Lab Results  Component Value Date   GLUCOSE 87 09/29/2024   NA 141 09/29/2024   K 4.3 09/29/2024   CL 103 09/29/2024   CO2 22 09/29/2024   BUN 14 09/29/2024   CREATININE 0.76 09/29/2024   EGFR 92 09/29/2024   CALCIUM 9.8 09/29/2024   PROT 6.8 09/29/2024   ALBUMIN 4.4 09/29/2024   LABGLOB 2.4 09/29/2024   BILITOT 0.4 09/29/2024   ALKPHOS 143 (H) 09/29/2024   AST 19 09/29/2024   ALT 14 09/29/2024   ANIONGAP 7 06/03/2021    Cancer screenings are reviewed with patient as obesity is a risk cancer for certain cancers.   Pap:08/31/24 negative Mammogram: 09/08/2024 negative Colonoscopy: 11/08/23 due 2031    PHYSICAL EXAM:  Blood pressure 123/71, pulse 63, temperature 98 F (36.7 C), height 5' 2 (1.575 m), weight 172 lb (78 kg), SpO2 98%. Body mass index is 31.46 kg/m.  General: Well Developed, well nourished,  and in no acute distress.  HEENT: Normocephalic, atraumatic; EOMI, sclerae are anicteric. Skin: Warm and dry, good turgor Chest:  Normal excursion, shape, no gross ABN Respiratory: No conversational dyspnea; speaking in full sentences NeuroM-Sk:  Normal gross ROM * 4 extremities  Psych: A and O X 3, insight adequate, mood- full    DIAGNOSTIC DATA REVIEWED: Labs reviewed from 09/23/24 CBC WBC (White Blood Cell Count) 4.1 - 10.2 10^3/uL 5.6  RBC (Red Blood Cell Count) 4.04 - 5.48 10^6/uL 3.94 Low   Hemoglobin 12.0 - 15.0 gm/dL  87.5  Hematocrit 64.9 - 47.0 % 37.6  MCV (Mean Corpuscular Volume) 80.0 - 100.0 fl 95.4  MCH (Mean Corpuscular Hemoglobin) 27.0 - 31.2 pg 31.5 High   MCHC (Mean Corpuscular Hemoglobin Concentration) 32.0 - 36.0 gm/dL 66.9  Platelet Count 849 - 450 10^3/uL 332  RDW-CV (Red Cell Distribution Width) 11.6 - 14.8 % 13.3  MPV (Mean Platelet Volume) 9.4 - 12.4 fl 9.1 Low   Neutrophils 1.50 - 7.80 10^3/uL 3.27  Lymphocytes 1.00 - 3.60 10^3/uL 1.51  Monocytes 0.00 - 1.50 10^3/uL 0.49  Eosinophils 0.00 - 0.55 10^3/uL 0.27  Basophils 0.00 - 0.09 10^3/uL 0.08  Neutrophil % 32.0 - 70.0 % 58.1  Lymphocyte % 10.0 - 50.0 % 26.8  Monocyte % 4.0 - 13.0 % 8.7  Eosinophil % 1.0 - 5.0 % 4.8  Basophil% 0.0 - 2.0 % 1.4  Immature Granulocyte % <=0.7 % 0.2  Immature Granulocyte Count <=0.06 10^3/L 0.01    CMP Units 6 d ago Comments  Glucose 70 - 110 mg/dL 94    Sodium 863 - 854 mmol/L 141    Potassium 3.6 - 5.1 mmol/L 4.0    Chloride 97 - 109 mmol/L 105    Carbon Dioxide (CO2) 22.0 - 32.0 mmol/L 28.5    Urea Nitrogen (BUN) 7 - 25 mg/dL 21    Creatinine 0.6 - 1.1 mg/dL 0.9    Glomerular Filtration Rate (eGFR) >60 mL/min/1.73sq m 75 CKD-EPI (2021) does not include patient's race in the calculation of eGFR.  Monitoring changes of plasma creatinine and eGFR over time is useful for monitoring kidney function.  Interpretive Ranges for eGFR (CKD-EPI 2021):  eGFR:       >60 mL/min/1.73 sq. m - Normal eGFR:       30-59 mL/min/1.73 sq. m - Moderately Decreased eGFR:       15-29 mL/min/1.73 sq. m  - Severely Decreased eGFR:       < 15 mL/min/1.73 sq. m  - Kidney Failure   Note: These eGFR calculations do not apply in acute situations when eGFR is changing rapidly or patients on dialysis.  Calcium 8.7 - 10.3 mg/dL 9.2    AST 8 - 39 U/L 14    ALT 5 - 38 U/L 10    Alk Phos (alkaline Phosphatase) 34 - 104 U/L 123 High     Albumin 3.5 - 4.8 g/dL 4.4    Bilirubin, Total 0.3 -  1.2 mg/dL 0.4    Protein, Total 6.1 - 7.9 g/dL 6.6    A/G Ratio 1.0 - 5.0 gm/dL 2.0        J8r Ref Range & Units 6 d ago  Hemoglobin A1C 4.2 - 5.6 % 5.2  Average Blood Glucose (Calc) mg/dL 896    Lipid Panel w/calc LDL Specimen: Blood Component Ref Range & Units 6 d ago  Cholesterol, Total 100 - 200 mg/dL 779 High   Triglyceride 35 - 199 mg/dL 98  HDL (  High Density Lipoprotein) Cholesterol 35.0 - 85.0 mg/dL 25.4  LDL Calculated 0 - 130 mg/dL 873  VLDL Cholesterol mg/dL 20  Cholesterol/HDL Ratio 3.0    Thyroid  Stimulating-Hormone (TSH) Specimen: Blood Component Ref Range & Units 6 d ago Comments  Thyroid  Stimulating Hormone (TSH) 0.450 - 5.330 uIU/mL 1.251      Vitamin B12 Specimen: Blood Component Ref Range & Units 6 d ago  Vitamin B12 >300 pg/mL 382    Vitamin D, 25-Hydroxy - [facility] 36.6 on 09/23/24    ASSESSMENT AND PLAN  Class 1 obesity with serious comorbidity and body mass index (BMI) of 31.0 to 31.9 in adult, unspecified obesity type TREATMENT PLAN FOR OBESITY:  Recommended Dietary Goals  Heather Faulkner is currently in the action stage of change. As such, her goal is to continue weight management plan. She has agreed to the Category 1 Plan.  Behavioral Intervention  We discussed the following Behavioral Modification Strategies today: celebration eating strategies- one plate with protein as largest portion, clean vegetable and then portion of anything she wants to try but food cannot touch on the plate, be mindful and enjoy your food  , continue to work on maintaining a reduced calorie state, getting the recommended amount of protein, incorporating whole foods, making healthy choices, staying well hydrated and practicing mindfulness when eating., and increase protein intake, fibrous foods (25 grams per day for women, 30 grams for men) and water  to improve satiety and decrease hunger signals. .  Additional resources provided today: skinnytaste website  reviewed  Recommended Physical Activity Goals  Heather Faulkner has been advised to work up to 150 minutes of moderate intensity aerobic activity a week and strengthening exercises 2-3 times per week for cardiovascular health, weight loss maintenance and preservation of muscle mass.   She has agreed to Patient will begin to exercise 20 minutes 2 days a week as initial goal, Think about enjoyable ways to increase daily physical activity and overcoming barriers to exercise, and Increase physical activity in their day and reduce sedentary time (increase NEAT).   Pharmacotherapy We discussed various medication options to help Heather Faulkner with her weight loss efforts and we both agreed to continue nutrition and begin exercise.  ASSOCIATED CONDITIONS ADDRESSED TODAY  Action/Plan  Prediabetes a1c 5.9 2023 Continue Category 1  meal plan, limit simple carbohydrates Decreasing body weight by 10-15% can improve glucose levels Start exercise with initial goal of 20 minutes 2 days a week Continue to follow regularly with PCP Dr. Sadie  Elevated alkaline phosphatase level       Normal AST/ALT, will recheck in 3 months and if remains elevated will order RUQ Ultrasound  Mixed hyperlipidemia Focus on implementing category 1 meal plan, limit saturated fats Start exercise with initial goal of 20 minutes 2 days a week Continue to follow regularly with PCP Dr. Sadie  Hypothyroidism, unspecified type       Last level in therapeutic range. Continue Levothyroxine 125 mcg every day       Continue to follow regularly with PCP Dr. Sadie           Return in about 3 weeks (around 11/03/2024).SABRA She was informed of the importance of frequent follow up visits to maximize her success with intensive lifestyle modifications for her multiple health conditions.   ATTESTASTION STATEMENTS:  Reviewed by clinician on day of visit: allergies, medications, problem list, medical history, surgical history, family history, social  history, and previous encounter notes.   I personally spent a total of 50 minutes  in the care of the patient today including preparing to see the patient, getting/reviewing separately obtained history, performing a medically appropriate exam/evaluation, counseling and educating, documenting clinical information in the EHR, communicating results, and coordinating care.   Joelene Barriere ANP-C

## 2024-10-13 NOTE — Patient Instructions (Addendum)
 Bio Impedance Data reviewed with patient: Muscle is up 1.4 pounds, adipose decreased 3.2 pounds. PBF decreased from 42.37% to 40.9% Celebration eating strategies- one plate with protein as largest portion, clean vegetable and then portion of anything she wants to try but food cannot touch on the plate, be mindful and enjoy your food    [employer] for recipes  Start exercise of 20 minutes 2 days a week as initial goal  Add whey isolate protein powder to oatmeal for making it a whole food. Add nuts and fruit

## 2024-10-26 DIAGNOSIS — M791 Myalgia, unspecified site: Secondary | ICD-10-CM | POA: Diagnosis not present

## 2024-10-26 DIAGNOSIS — R7689 Other specified abnormal immunological findings in serum: Secondary | ICD-10-CM | POA: Diagnosis not present

## 2024-10-26 DIAGNOSIS — Z791 Long term (current) use of non-steroidal anti-inflammatories (NSAID): Secondary | ICD-10-CM | POA: Diagnosis not present

## 2024-10-27 DIAGNOSIS — F411 Generalized anxiety disorder: Secondary | ICD-10-CM | POA: Diagnosis not present

## 2024-11-05 ENCOUNTER — Ambulatory Visit (INDEPENDENT_AMBULATORY_CARE_PROVIDER_SITE_OTHER): Admitting: Nurse Practitioner

## 2024-12-17 ENCOUNTER — Ambulatory Visit

## 2024-12-17 DIAGNOSIS — L918 Other hypertrophic disorders of the skin: Secondary | ICD-10-CM

## 2024-12-17 DIAGNOSIS — L249 Irritant contact dermatitis, unspecified cause: Secondary | ICD-10-CM

## 2024-12-17 DIAGNOSIS — D239 Other benign neoplasm of skin, unspecified: Secondary | ICD-10-CM

## 2024-12-17 DIAGNOSIS — D229 Melanocytic nevi, unspecified: Secondary | ICD-10-CM

## 2024-12-17 DIAGNOSIS — L578 Other skin changes due to chronic exposure to nonionizing radiation: Secondary | ICD-10-CM

## 2024-12-17 DIAGNOSIS — W908XXA Exposure to other nonionizing radiation, initial encounter: Secondary | ICD-10-CM | POA: Diagnosis not present

## 2024-12-17 DIAGNOSIS — L821 Other seborrheic keratosis: Secondary | ICD-10-CM

## 2024-12-17 DIAGNOSIS — L239 Allergic contact dermatitis, unspecified cause: Secondary | ICD-10-CM | POA: Diagnosis not present

## 2024-12-17 DIAGNOSIS — D1801 Hemangioma of skin and subcutaneous tissue: Secondary | ICD-10-CM | POA: Diagnosis not present

## 2024-12-17 DIAGNOSIS — L814 Other melanin hyperpigmentation: Secondary | ICD-10-CM

## 2024-12-17 DIAGNOSIS — L570 Actinic keratosis: Secondary | ICD-10-CM | POA: Diagnosis not present

## 2024-12-17 DIAGNOSIS — Z1283 Encounter for screening for malignant neoplasm of skin: Secondary | ICD-10-CM

## 2024-12-17 MED ORDER — TACROLIMUS 0.1 % EX OINT: TOPICAL_OINTMENT | CUTANEOUS | 2 refills | Status: AC

## 2024-12-17 NOTE — Progress Notes (Signed)
 "   Subjective   Heather Faulkner is a 54 y.o. female who presents for the following: Total body skin exam for skin cancer screening and mole check. The patient has spots, moles and lesions to be evaluated, some may be new or changing and the patient may have concern these could be cancer. Patient is new patient.  Today patient reports: No personal hx of skin cancer, hx of skin cancer both parents.   Review of Systems:    No other skin or systemic complaints except as noted in HPI or Assessment and Plan.  The following portions of the chart were reviewed this encounter and updated as appropriate: medications, allergies, medical history  Relevant Medical History:  Family history of skin cancer - Parents   Objective  (SKPE) Well appearing patient in no apparent distress; mood and affect are within normal limits. Examination was performed of the: Full Skin Examination: scalp, head, eyes, ears, nose, lips, neck, chest, axillae, abdomen, back, buttocks, bilateral upper extremities, bilateral lower extremities, hands, feet, fingers, toes, fingernails, and toenails.   Examination notable for: SKIN EXAM, Angioma(s): Scattered red vascular papule(s)  , Lentigo/lentigines: Scattered pigmented macules that are tan to brown in color and are somewhat non-uniform in shape and concentrated in the sun-exposed areas, Nevus/nevi: Scattered well-demarcated, regular, pigmented macule(s) and/or papule(s)  , Seborrheic Keratosis(es): Stuck-on appearing keratotic papule(s) on the trunk, none  irritated with redness, crusting, edema, and/or partial avulsion, Actinic Damage/Elastosis: chronic sun damage: dyspigmentation, telangiectasia, and wrinkling, Actinic keratosis: Scaly erythematous macule(s) concentrated on sun exposed areas , Dermatofibroma(s): Firm dermal nodule(s) with a hyperpigmented halo and a positive dimple sign , Acrochordon(s): soft, fleshy, skin-colored to tan pedunculated papules located on the axilla   Lichenification, erythema aroun eyes  Examination limited by: N/a  Right forearm x1 Pink scaly macules  Assessment & Plan  (SKAP)   SKIN CANCER SCREENING PERFORMED TODAY.  BENIGN SKIN FINDINGS  - Lentigines  - Seborrheic keratoses  - Hemangiomas   - Nevus/Multiple Benign Nevi - Acrochordon  - Dermatofibroma  - Reassurance provided regarding the benign appearance of lesions noted on exam today; no treatment is indicated in the absence of symptoms/changes. - Reinforced importance of photoprotective strategies including liberal and frequent sunscreen use of a broad-spectrum SPF 30 or greater, use of protective clothing, and sun avoidance for prevention of cutaneous malignancy and photoaging.  Counseled patient on the importance of regular self-skin monitoring as well as routine clinical skin examinations as scheduled.   ACTINIC DAMAGE - Chronic condition, secondary to cumulative UV/sun exposure - Recommend daily broad spectrum sunscreen SPF 30+ to sun-exposed areas, reapply every 2 hours as needed.  - Staying in the shade or wearing long sleeves, sun glasses (UVA+UVB protection) and wide brim hats (4-inch brim around the entire circumference of the hat) are also recommended for sun protection.  - Call for new or changing lesions.  Allergic contact dermatitis periorbital, trigger: unk  Chronic and persistent condition with duration or expected duration over one year. Condition is symptomatic and bothersome to patient. Patient is flaring and not currently at treatment goal.  - Diagnosis, treatment options, prognosis, risk/ benefit, and side effects of treatment were discussed with the patient - Discussed that this dermatitis can develop at any time and may be new or old products or other substances that the patient is coming into contact with - Recommended gentle skin care, including avoidance of fragrance products, harsh soaps and detergents - Recommend gentle skin care regimen, recommend  going  a few days without eye makeup and slowly reintroduce. Start tacrolimus  ointment 0.1% twice daily on under eyes when red, itchy, scaly.  Educated about black box warning (and also that recent studies show no increased malignancy risk) and common adverse effects such as burning with application (advised to cool in fridge and only apply to bone-dry skin). Given location of eruption, unsafe to use daily topical CS to area. Patient requires topical calcineurin inhibitor given high risk of dyspigmentation and atrophy from topical CS use in sensitive area.  Was sun protection counseling provided?: Yes   Level of service outlined above   Patient instructions (SKPI)   Procedures, orders, diagnosis for this visit:  AK (ACTINIC KERATOSIS) Right forearm x1 Actinic keratoses are precancerous spots that appear secondary to cumulative UV radiation exposure/sun exposure over time. They are chronic with expected duration over 1 year. A portion of actinic keratoses will progress to squamous cell carcinoma of the skin. It is not possible to reliably predict which spots will progress to skin cancer and so treatment is recommended to prevent development of skin cancer.  Recommend daily broad spectrum sunscreen SPF 30+ to sun-exposed areas, reapply every 2 hours as needed.  Recommend staying in the shade or wearing long sleeves, sun glasses (UVA+UVB protection) and wide brim hats (4-inch brim around the entire circumference of the hat). Call for new or changing lesions. - Destruction of lesion - Right forearm x1 Complexity: simple   Destruction method: cryotherapy   Informed consent: discussed and consent obtained   Timeout:  patient name, date of birth, surgical site, and procedure verified Lesion destroyed using liquid nitrogen: Yes   Region frozen until ice ball extended beyond lesion: Yes   Cryo cycles: 1 or 2. Outcome: patient tolerated procedure well with no complications   Post-procedure  details: wound care instructions given   Additional details:  Prior to procedure, discussed risks of blister formation, small wound, skin dyspigmentation, or rare scar following cryotherapy. Recommend Vaseline ointment to treated areas while healing.    AK (actinic keratosis) -     Destruction of lesion  Other orders -     Tacrolimus ; Apply 2 grams twice daily to affected areas of skin  Dispense: 30 g; Refill: 2    Return to clinic: Return in about 1 year (around 12/17/2025) for TBSE, w/ Dr. Raymund.  Heather Faulkner, CMA, am acting as scribe for Lauraine JAYSON Raymund, MD.  Documentation: I have reviewed the above documentation for accuracy and completeness, and I agree with the above.  Lauraine JAYSON Raymund, MD  "

## 2024-12-17 NOTE — Patient Instructions (Signed)
 Sunscreen  Who needs sunscreen? Everyone. Sunscreen use can help prevent skin cancer by protecting you from the sun's harmful ultraviolet rays. Anyone can get skin cancer, regardless of age, gender or race. In fact, it is estimated that one in five Americans will develop skin cancer in their lifetime.  Sunscreen alone cannot fully protect you. In addition to wearing sunscreen, dermatologists recommend taking the following steps to protect your skin and find skin cancer early:  Seek shade when appropriate, remembering that the sun's rays are strongest between 10 a.m. and 2 p.m. If your shadow is shorter than you are, seek shade. Dress to protect yourself from the sun by wearing a lightweight long-sleeved shirt, pants, a wide-brimmed hat and sunglasses, when possible.  Use extra caution near water , snow and sand as they reflect the damaging rays of the sun, which can increase your chance of sunburn.  Get vitamin D  safely through a healthy diet that may include vitamin supplements. Don't seek the sun. Avoid tanning beds. Ultraviolet light from the sun and tanning beds can cause skin cancer and wrinkling. If you want to look tan, you may wish to use a self-tanning product, but continue to use sunscreen with it.  When should I use sunscreen? Every day you go outside--even if you're just walking to and from your form of transportation. The sun emits harmful UV rays year-round. Even on cloudy days, up to 80 percent of the sun's harmful UV rays can penetrate your skin. Snow, sand and water  increase the need for sunscreen because they reflect the sun's rays.  How much sunscreen should I use, and how often should I apply it? Most people only apply 25-50 percent of the recommended amount of sunscreen. Apply enough sunscreen to cover all exposed skin. Most adults need about 1 ounce -- or enough to fill a shot glass -- to fully cover their body.  Don't forget to apply to the tops of your feet, your neck, your ears  and the top of your head. Apply sunscreen to dry skin 15 minutes before going outdoors.  Skin cancer also can form on the lips. To protect your lips, apply a lip balm or lipstick that contains sunscreen with an SPF of 30 or higher.  When outdoors, reapply sunscreen approximately every two hours, or after swimming or sweating, according to the directions on the bottle.   Broad-spectrum sunscreens protect against both UVA and UVB rays. What is the difference between the rays? Sunlight consists of two types of harmful rays that reach the earth -- UVA rays and UVB rays. Overexposure to either can lead to skin cancer. In addition to causing skin cancer, here's what each of these rays do:  UVA rays (or aging rays) can prematurely age your skin, causing wrinkles and age spots, and can pass through window glass. UVB rays (or burning rays) are the primary cause of sunburn and are blocked by window glass  There is no safe way to tan. Every time you tan, you damage your skin. As this damage builds, you speed up the aging of your skin and increase your risk for all types of skin cancer.  What is the difference between chemical and physical sunscreens? Chemical sunscreens work like a sponge, absorbing the sun's rays. They contain one or more of the following active ingredients: oxybenzone, avobenzone, octisalate, octocrylene, homosalate and octinoxate. These formulations tend to be easier to rub into the skin without leaving a white residue.   Physical sunscreens work like a shield,  sitting sit on the surface of your skin and deflecting the sun's rays. They contain the active ingredients zinc oxide and/or titanium dioxide. Use this sunscreen if you have sensitive skin.   What type of sunscreen should I use? The best type of sunscreen is the one you will use again and again. Just make sure it offers broad-spectrum (UVA and UVB) protection, has an SPF of 30+, and is water -resistant. The kind of sunscreen you use is  a matter of personal choice, and may vary depending on the area of the body to be protected. Available sunscreen options include lotions, creams, gels, ointments, wax sticks and sprays.  Recommended physical sunscreens for face: - Neutrogena Sheer Zinc - Aveeno Positively Mineral Sensitive - CeraVe Hydrating Mineral (also has a tinted version) - La Roche-Posay Anthelios Mineral Face (comes as a cream, lotion, light fluid, and there is also a tinted version).  - EltaMD UV Clear (also has a tinted version)  Recommended physical sunscreens for body: - Neutrogena Sheer Zinc Dry-Touch Sunscreen Sensitive Skin Lotion Broad Spectrum SPF 50 - Aveeno Positively Mineral Sensitive Skin Sunscreen Broad Spectrum SPF 50 - La Roche-Posay Anthelios SPF 50 Mineral Sunscreen - Gentle Lotion - CeraVe Hydrating Mineral Sunscreen SPF 50  Recommended chemical sunscreens for face: - Anthelios UV Correct Face Sunscreen SPF 70 with Niacinamide - Neutrogena Clear Face Oil-Free SPF 50 with Helioplex - Neutrogena Sport Face Oil-Free SPF 70+ with Helioplex - Aveeno Protect + Hydrate Sunscreen For Face SPF 70 - La Roche-Posay Anthelios Light Fluid Sunscreen for Face SPF 60  Recommended chemical sunscreens for body: - Neutrogena Ultra Sheer Dry-Touch Sunscreen SPF 70 - Aveeno Protect + Hydrate Broad Spectrum All-Day Hydration SPF 60 (comes in a big pump) - La Roche-Posay Anthelios Melt-In Milk Sunscreen SPF 60    Due to recent changes in healthcare laws, you may see results of your pathology and/or laboratory studies on MyChart before the doctors have had a chance to review them. We understand that in some cases there may be results that are confusing or concerning to you. Please understand that not all results are received at the same time and often the doctors may need to interpret multiple results in order to provide you with the best plan of care or course of treatment. Therefore, we ask that you please give us  2  business days to thoroughly review all your results before contacting the office for clarification. Should we see a critical lab result, you will be contacted sooner.   If You Need Anything After Your Visit  If you have any questions or concerns for your doctor, please call our main line at [phone] and press option 4 to reach your doctor's medical assistant. If no one answers, please leave a voicemail as directed and we will return your call as soon as possible. Messages left after 4 pm will be answered the following business day.   You may also send us  a message via MyChart. We typically respond to MyChart messages within 1-2 business days.  For prescription refills, please ask your pharmacy to contact our office. Our fax number is [phone].  If you have an urgent issue when the clinic is closed that cannot wait until the next business day, you can page your doctor at the number below.    Please note that while we do our best to be available for urgent issues outside of office hours, we are not available 24/7.   If you have an urgent issue and are  unable to reach us , you may choose to seek medical care at your doctor's office, retail clinic, urgent care center, or emergency room.  If you have a medical emergency, please immediately call 911 or go to the emergency department.  Pager Numbers  - Dr. Hester: [phone]  - Dr. Jackquline: [phone]  - Dr. Claudene: [phone]   In the event of inclement weather, please call our main line at [phone] for an update on the status of any delays or closures.  Dermatology Medication Tips: Please keep the boxes that topical medications come in in order to help keep track of the instructions about where and how to use these. Pharmacies typically print the medication instructions only on the boxes and not directly on the medication tubes.   If your medication is too expensive, please contact our office at [phone] option 4 or  send us  a message through MyChart.   We are unable to tell what your co-pay for medications will be in advance as this is different depending on your insurance coverage. However, we may be able to find a substitute medication at lower cost or fill out paperwork to get insurance to cover a needed medication.   If a prior authorization is required to get your medication covered by your insurance company, please allow us  1-2 business days to complete this process.  Drug prices often vary depending on where the prescription is filled and some pharmacies may offer cheaper prices.  The website www.goodrx.com contains coupons for medications through different pharmacies. The prices here do not account for what the cost may be with help from insurance (it may be cheaper with your insurance), but the website can give you the price if you did not use any insurance.  - You can print the associated coupon and take it with your prescription to the pharmacy.  - You may also stop by our office during regular business hours and pick up a GoodRx coupon card.  - If you need your prescription sent electronically to a different pharmacy, notify our office through [facility] or by phone at [phone] option 4.     Si Usted Necesita Algo Despus de Su Visita  Tambin puede enviarnos un mensaje a travs de Clinical cytogeneticist. Por lo general respondemos a los mensajes de MyChart en el transcurso de 1 a 2 das hbiles.  Para renovar recetas, por favor pida a su farmacia que se ponga en contacto con nuestra oficina. Randi lakes de fax es [place] [phone].  Si tiene un asunto urgente cuando la clnica est cerrada y que no puede esperar hasta el siguiente da hbil, puede llamar/localizar a su doctor(a) al nmero que aparece a continuacin.   Por favor, tenga en cuenta que aunque hacemos todo lo posible para estar disponibles para asuntos urgentes fuera del horario de [place], no estamos disponibles las 24 horas del  da, los 7 [address]  [address] de la [place].   Si tiene un problema urgente y no puede comunicarse con nosotros, puede optar por buscar atencin mdica  en el consultorio de su doctor(a), en una clnica privada, en un centro de atencin urgente o en una sala de emergencias.  Si tiene Engineer, drilling, por favor llame inmediatamente al 911 o vaya a la sala de emergencias.  Nmeros de bper  - Dr. Hester: [phone]  - Dra. Jackquline: [phone]  - Dr. Claudene: [phone]   En caso de inclemencias del tiempo, por favor llame a landry capes principal al [phone] para ignacia actualizacin sobre  el estado de cualquier retraso o cierre.  Consejos para la medicacin en dermatologa: Por favor, guarde las cajas en las que vienen los medicamentos de uso tpico para ayudarle a seguir las instrucciones sobre dnde y cmo usarlos. Las farmacias generalmente imprimen las instrucciones del medicamento slo en las cajas y no directamente en los tubos del [place].   Si su medicamento es muy caro, por favor, pngase en contacto con landry rieger llamando al [phone] y presione la opcin 4 o envenos un mensaje a travs de Clinical cytogeneticist.   No podemos decirle cul ser su copago por los medicamentos por adelantado ya que esto es diferente dependiendo de la cobertura de su seguro. Sin embargo, es posible que podamos encontrar un medicamento sustituto a Audiological scientist un formulario para que el seguro cubra el medicamento que se considera necesario.   Si se requiere una autorizacin previa para que su compaa de seguros malta su medicamento, por favor permtanos de 1 a 2 das hbiles para completar este proceso.  Los precios de los medicamentos varan con frecuencia dependiendo del Environmental consultant de dnde se surte la receta y alguna farmacias pueden ofrecer precios ms baratos.  El sitio web www.goodrx.com tiene cupones para medicamentos de Health and safety inspector. Los precios aqu no tienen en cuenta lo que podra  costar con la ayuda del seguro (puede ser ms barato con su seguro), pero el sitio web puede darle el precio si no utiliz Tourist information centre manager.  - Puede imprimir el cupn correspondiente y llevarlo con su receta a la farmacia.  - Tambin puede pasar por nuestra oficina durante el horario de atencin regular y Education officer, museum una tarjeta de cupones de GoodRx.  - Si necesita que su receta se enve electrnicamente a una farmacia diferente, informe a nuestra oficina a travs de MyChart de [place] o por telfono llamando al [phone] y presione la opcin 4.

## 2024-12-23 ENCOUNTER — Other Ambulatory Visit: Payer: Self-pay | Admitting: Pulmonary Disease

## 2024-12-23 DIAGNOSIS — R0602 Shortness of breath: Secondary | ICD-10-CM

## 2024-12-23 DIAGNOSIS — R053 Chronic cough: Secondary | ICD-10-CM

## 2025-12-20 ENCOUNTER — Encounter
